# Patient Record
Sex: Female | Born: 1970 | Race: Black or African American | Hispanic: Yes | Marital: Single | Smoking: Never smoker
Health system: Southern US, Community
[De-identification: ages and names within clinical notes are randomized; demographics above are authoritative.]

## PROBLEM LIST (undated history)

## (undated) DIAGNOSIS — F32A Depression, unspecified: Secondary | ICD-10-CM

## (undated) DIAGNOSIS — R0789 Other chest pain: Secondary | ICD-10-CM

## (undated) DIAGNOSIS — E119 Type 2 diabetes mellitus without complications: Secondary | ICD-10-CM

## (undated) DIAGNOSIS — D649 Anemia, unspecified: Secondary | ICD-10-CM

## (undated) DIAGNOSIS — G4730 Sleep apnea, unspecified: Secondary | ICD-10-CM

## (undated) DIAGNOSIS — M1990 Unspecified osteoarthritis, unspecified site: Secondary | ICD-10-CM

## (undated) DIAGNOSIS — I10 Essential (primary) hypertension: Secondary | ICD-10-CM

## (undated) DIAGNOSIS — F419 Anxiety disorder, unspecified: Secondary | ICD-10-CM

## (undated) HISTORY — DX: Essential (primary) hypertension: I10

## (undated) HISTORY — DX: Morbid (severe) obesity due to excess calories: E66.01

## (undated) HISTORY — DX: Type 2 diabetes mellitus without complications: E11.9

## (undated) HISTORY — PX: ABDOMINAL HYSTERECTOMY: SHX81

## (undated) HISTORY — DX: Anemia, unspecified: D64.9

## (undated) HISTORY — DX: Other chest pain: R07.89

## (undated) HISTORY — DX: Unspecified osteoarthritis, unspecified site: M19.90

## (undated) HISTORY — DX: Depression, unspecified: F32.A

## (undated) HISTORY — DX: Sleep apnea, unspecified: G47.30

## (undated) HISTORY — DX: Anxiety disorder, unspecified: F41.9

---

## 2014-09-27 ENCOUNTER — Emergency Department (HOSPITAL_COMMUNITY): Payer: Self-pay

## 2014-09-27 ENCOUNTER — Encounter (HOSPITAL_COMMUNITY): Payer: Self-pay | Admitting: Family Medicine

## 2014-09-27 ENCOUNTER — Emergency Department (HOSPITAL_COMMUNITY)
Admission: EM | Admit: 2014-09-27 | Discharge: 2014-09-27 | Disposition: A | Discharge status: Home / Self Care | Payer: Self-pay | Attending: Emergency Medicine | Admitting: Emergency Medicine

## 2014-09-27 DIAGNOSIS — M25561 Pain in right knee: Secondary | ICD-10-CM | POA: Insufficient documentation

## 2014-09-27 DIAGNOSIS — I10 Essential (primary) hypertension: Secondary | ICD-10-CM | POA: Insufficient documentation

## 2014-09-27 DIAGNOSIS — Z791 Long term (current) use of non-steroidal anti-inflammatories (NSAID): Secondary | ICD-10-CM | POA: Insufficient documentation

## 2014-09-27 MED ORDER — HYDROCODONE-ACETAMINOPHEN 5-325 MG PO TABS
1.0000 | ORAL_TABLET | Freq: Once | ORAL | Status: AC
  Administered 2014-09-27: 1 via ORAL
  Filled 2014-09-27: qty 1

## 2014-09-27 MED ORDER — IBUPROFEN 800 MG PO TABS
800.0000 mg | ORAL_TABLET | Freq: Once | ORAL | Status: AC
  Administered 2014-09-27: 800 mg via ORAL
  Filled 2014-09-27: qty 1

## 2014-09-27 NOTE — ED Notes (Signed)
Pt having right knee pain behind her knee and swelling.

## 2014-09-27 NOTE — ED Notes (Signed)
Pt placed into gown and on monitor upon arrival to room. Pt monitored by blood pressure, pulse ox. Pts family remains at bedside.

## 2014-09-27 NOTE — ED Provider Notes (Signed)
CSN: [id number]     Arrival date & time 09/27/14  0900 History   First MD Initiated Contact with Patient 09/27/14 [phone]     Chief Complaint  Patient presents with  . Knee Pain     (Consider location/radiation/quality/duration/timing/severity/associated sxs/prior Treatment) HPI  Kathleen Stout is a 43 y.o. morbidly obsese female with PMH of HTN presenting with right knee pain for two week without worsening. Patient notes sharp pain and swelling behind knee. Pain worse with standing, walking. Never had knee pain before. Pt works as a Arboriculturistcustodian. Denies injury/trauma or history of trauma. No fever/chills, nausea, vomiting. Pt denies weakness, numbness tingling. Patient ambulatory. Patient new to the area and without PCP, has never seen a orthopedist.   Past Medical History  Diagnosis Date  . Hypertension    Past Surgical History  Procedure Laterality Date  . Abdominal hysterectomy     History reviewed. No pertinent family history. History  Substance Use Topics  . Smoking status: Never Smoker   . Smokeless tobacco: Not on file  . Alcohol Use: No   OB History    No data available     Review of Systems  Musculoskeletal: Negative for joint swelling.  Skin: Negative for wound.  Neurological: Negative for weakness and numbness.      Allergies  Review of patient's allergies indicates no known allergies.  Home Medications   Prior to Admission medications   Medication Sig Start Date End Date Taking? Authorizing Provider  naproxen sodium (ANAPROX) 220 MG tablet Take 440 mg by mouth 2 (two) times daily with a meal.   Yes Historical Provider, MD   BP 157/128 mmHg  Pulse 94  Temp(Src) 98.1 F (36.7 C) (Oral)  Resp 22  SpO2 98% Physical Exam  Constitutional: She appears well-developed and well-nourished.  HENT:  Head: Normocephalic and atraumatic.  Eyes: Conjunctivae are normal. Right eye exhibits no discharge. Left eye exhibits no discharge.  Pulmonary/Chest: Effort normal.   Musculoskeletal:  Tenderness over lateral joint line of right knee. Pain with ROM. No laxity noted, erythema, warmth. Edema hard to determine due to patient's body habitus. No joint effusion. Mild edema of posterior knee without mass, cyst. Pt antalgic gait. Neurovascularly intact.  Neurological: She is alert. She exhibits normal muscle tone.  Skin: Skin is warm and dry.  Psychiatric: She has a normal mood and affect. Her behavior is normal.  Nursing note and vitals reviewed.   ED Course  Procedures (including critical care time) Labs Review Labs Reviewed - No data to display  Imaging Review No results found.   EKG Interpretation None      Meds given in ED:  Medications  HYDROcodone-acetaminophen (NORCO/VICODIN) 5-325 MG per tablet 1 tablet (1 tablet Oral Given 09/27/14 1251)  ibuprofen (ADVIL,MOTRIN) tablet 800 mg (800 mg Oral Given 09/27/14 1251)    Discharge Medication List as of 09/27/2014  1:08 PM        MDM   Final diagnoses:  Knee pain, right   Patient with one week history of right knee pain. Tenderness to lateral joint ling on right and pain with ROM and ambulation. VSS. Neurovascularly intact. No laxity, warmth, edema, erythema, fevers. Antalgic gait. I doubt septic arthritis. tx with RICE and follow up with orthopedist for further work up.   Discussed return precautions with patient. Discussed all results and patient verbalizes understanding and agrees with plan.  This is a shared patient. This patient was discussed with the physician who saw and evaluated the patient and  agrees with the plan.     Louann SjogrenVictoria L Oberon Hehir, PA-C 09/27/14 [phone]

## 2014-09-27 NOTE — Discharge Instructions (Signed)
Return to the emergency room with worsening of symptoms, new symptoms or with symptoms that are concerning, especially fevers, chills, nausea, vomiting, swelling, redness, severe tenderness or warmth.  RICE: Rest, Ice (three cycles of 20 mins on, 20mins off at least twice a day), compression/brace, elevation. Heating pad works well for back pain. Ibuprofen 400mg  (2 tablets 200mg ) every 5-6 hours for 3-5 days and then as needed for pain. Follow up with orthopedist. Call tomorrow morning for appointment. Information provided above.   Arthritis, Nonspecific Arthritis is inflammation of a joint. This usually means pain, redness, warmth or swelling are present. One or more joints may be involved. There are a number of types of arthritis. Your caregiver may not be able to tell what type of arthritis you have right away. CAUSES  The most common cause of arthritis is the wear and tear on the joint (osteoarthritis). This causes damage to the cartilage, which can break down over time. The knees, hips, back and neck are most often affected by this type of arthritis. Other types of arthritis and common causes of joint pain include:  Sprains and other injuries near the joint. Sometimes minor sprains and injuries cause pain and swelling that develop hours later.  Rheumatoid arthritis. This affects hands, feet and knees. It usually affects both sides of your body at the same time. It is often associated with chronic ailments, fever, weight loss and general weakness.  Crystal arthritis. Gout and pseudo gout can cause occasional acute severe pain, redness and swelling in the foot, ankle, or knee.  Infectious arthritis. Bacteria can get into a joint through a break in overlying skin. This can cause infection of the joint. Bacteria and viruses can also spread through the blood and affect your joints.  Drug, infectious and allergy reactions. Sometimes joints can become mildly painful and slightly swollen with these  types of illnesses. SYMPTOMS   Pain is the main symptom.  Your joint or joints can also be red, swollen and warm or hot to the touch.  You may have a fever with certain types of arthritis, or even feel overall ill.  The joint with arthritis will hurt with movement. Stiffness is present with some types of arthritis. DIAGNOSIS  Your caregiver will suspect arthritis based on your description of your symptoms and on your exam. Testing may be needed to find the type of arthritis:  Blood and sometimes urine tests.  X-ray tests and sometimes CT or MRI scans.  Removal of fluid from the joint (arthrocentesis) is done to check for bacteria, crystals or other causes. Your caregiver (or a specialist) will numb the area over the joint with a local anesthetic, and use a needle to remove joint fluid for examination. This procedure is only minimally uncomfortable.  Even with these tests, your caregiver may not be able to tell what kind of arthritis you have. Consultation with a specialist (rheumatologist) may be helpful. TREATMENT  Your caregiver will discuss with you treatment specific to your type of arthritis. If the specific type cannot be determined, then the following general recommendations may apply. Treatment of severe joint pain includes:  Rest.  Elevation.  Anti-inflammatory medication (for example, ibuprofen) may be prescribed. Avoiding activities that cause increased pain.  Only take over-the-counter or prescription medicines for pain and discomfort as recommended by your caregiver.  Cold packs over an inflamed joint may be used for 10 to 15 minutes every hour. Hot packs sometimes feel better, but do not use overnight. Do not use  hot packs if you are diabetic without your caregiver's permission.  A cortisone shot into arthritic joints may help reduce pain and swelling.  Any acute arthritis that gets worse over the next 1 to 2 days needs to be looked at to be sure there is no joint  infection. Long-term arthritis treatment involves modifying activities and lifestyle to reduce joint stress jarring. This can include weight loss. Also, exercise is needed to nourish the joint cartilage and remove waste. This helps keep the muscles around the joint strong. HOME CARE INSTRUCTIONS   Do not take aspirin to relieve pain if gout is suspected. This elevates uric acid levels.  Only take over-the-counter or prescription medicines for pain, discomfort or fever as directed by your caregiver.  Rest the joint as much as possible.  If your joint is swollen, keep it elevated.  Use crutches if the painful joint is in your leg.  Drinking plenty of fluids may help for certain types of arthritis.  Follow your caregiver's dietary instructions.  Try low-impact exercise such as:  Swimming.  Water aerobics.  Biking.  Walking.  Morning stiffness is often relieved by a warm shower.  Put your joints through regular range-of-motion. SEEK MEDICAL CARE IF:   You do not feel better in 24 hours or are getting worse.  You have side effects to medications, or are not getting better with treatment. SEEK IMMEDIATE MEDICAL CARE IF:   You have a fever.  You develop severe joint pain, swelling or redness.  Many joints are involved and become painful and swollen.  There is severe back pain and/or leg weakness.  You have loss of bowel or bladder control. Document Released: 12/21/2004 Document Revised: 02/05/2012 Document Reviewed: 01/06/2009 [facility] Patient Information 2015 [employer] [place]. This information is not intended to replace advice given to you by your health care provider. Make sure you discuss any questions you have with your health care provider.

## 2015-04-19 ENCOUNTER — Encounter (HOSPITAL_COMMUNITY): Payer: Self-pay | Admitting: Family Medicine

## 2015-04-19 ENCOUNTER — Emergency Department (HOSPITAL_COMMUNITY)
Admission: EM | Admit: 2015-04-19 | Discharge: 2015-04-19 | Disposition: A | Discharge status: Home / Self Care | Payer: Self-pay | Attending: Emergency Medicine | Admitting: Emergency Medicine

## 2015-04-19 ENCOUNTER — Emergency Department (HOSPITAL_BASED_OUTPATIENT_CLINIC_OR_DEPARTMENT_OTHER): Admit: 2015-04-19 | Discharge: 2015-04-19 | Disposition: A | Discharge status: Home / Self Care | Payer: Self-pay

## 2015-04-19 ENCOUNTER — Emergency Department (HOSPITAL_COMMUNITY): Payer: Self-pay

## 2015-04-19 DIAGNOSIS — M1712 Unilateral primary osteoarthritis, left knee: Secondary | ICD-10-CM | POA: Insufficient documentation

## 2015-04-19 DIAGNOSIS — I10 Essential (primary) hypertension: Secondary | ICD-10-CM | POA: Insufficient documentation

## 2015-04-19 DIAGNOSIS — M25562 Pain in left knee: Secondary | ICD-10-CM

## 2015-04-19 DIAGNOSIS — M79609 Pain in unspecified limb: Secondary | ICD-10-CM

## 2015-04-19 MED ORDER — AMLODIPINE BESYLATE 5 MG PO TABS: 5.0000 mg | ORAL_TABLET | Freq: Once | ORAL | Status: DC

## 2015-04-19 MED ORDER — TRAMADOL HCL 50 MG PO TABS: 50.0000 mg | ORAL_TABLET | Freq: Four times a day (QID) | ORAL | Status: DC | PRN

## 2015-04-19 MED ORDER — AMLODIPINE BESYLATE 5 MG PO TABS
5.0000 mg | ORAL_TABLET | Freq: Once | ORAL | Status: AC
  Administered 2015-04-19: 5 mg via ORAL
  Filled 2015-04-19: qty 1

## 2015-04-19 NOTE — ED Provider Notes (Signed)
CSN: [id number]     Arrival date & time 04/19/15  1154 History   First MD Initiated Contact with Patient 04/19/15 1244     Chief Complaint  Patient presents with  . Leg Pain     (Consider location/radiation/quality/duration/timing/severity/associated sxs/prior Treatment) HPI Pt is a 44yo morbidly obese female presenting to ED with c/o gradually worsening Left knee pain and stiffness for 1 month.  Pt c/o constant, aching sore, 10/10, no relief with OTC medications. pain does improve with rest, worse with weight bearing and ambulation.  Denies falls or known injuries. Denies hx of previous knee surgeries. Denies hx of blood clots. Pt is not on estrogen pills. Is not a smoker. No recent travel. Denies redness or warmth.  Denies fever, n/v/d.   Past Medical History  Diagnosis Date  . Hypertension    Past Surgical History  Procedure Laterality Date  . Abdominal hysterectomy     History reviewed. No pertinent family history. History  Substance Use Topics  . Smoking status: Never Smoker   . Smokeless tobacco: Not on file  . Alcohol Use: No   OB History    No data available     Review of Systems  Constitutional: Negative for fever, chills, diaphoresis, appetite change and fatigue.  Musculoskeletal: Positive for myalgias, joint swelling, arthralgias and gait problem (due to severe pain in Left knee).       Left knee pain  Skin: Negative for color change, pallor, rash and wound.  Neurological: Negative for weakness and numbness.  All other systems reviewed and are negative.     Allergies  Review of patient's allergies indicates no known allergies.  Home Medications   Prior to Admission medications   Medication Sig Start Date End Date Taking? Authorizing Provider  ibuprofen (ADVIL,MOTRIN) 200 MG tablet Take 200 mg by mouth every 6 (six) hours as needed for fever or moderate pain.   Yes Historical Provider, MD  naproxen sodium (ANAPROX) 220 MG tablet Take 220 mg by mouth 2  (two) times daily as needed (pain).    Yes Historical Provider, MD  amLODipine (NORVASC) 5 MG tablet Take 1 tablet (5 mg total) by mouth once. 04/19/15   Junius Finner, PA-C  traMADol (ULTRAM) 50 MG tablet Take 1 tablet (50 mg total) by mouth every 6 (six) hours as needed. 04/19/15   Junius Finner, PA-C   BP 168/134 mmHg  Pulse 80  Temp(Src) 98.2 F (36.8 C) (Oral)  Resp 16  Ht  (1.626 m)  Wt 350 lb (158.759 kg)  BMI 60.05 kg/m2  SpO2 98% Physical Exam  Constitutional: She is oriented to person, place, and time. She appears well-developed and well-nourished.  Morbidly obese female lying in exam bed, NAD  HENT:  Head: Normocephalic and atraumatic.  Eyes: EOM are normal.  Neck: Normal range of motion.  Cardiovascular: Normal rate, regular rhythm and normal heart sounds.   Pulses:      Dorsalis pedis pulses are 2+ on the right side, and 2+ on the left side.  Pulmonary/Chest: Effort normal and breath sounds normal. No respiratory distress. She has no rales. She exhibits no tenderness.  Musculoskeletal: Normal range of motion. She exhibits tenderness. She exhibits no edema.  Left knee: obese lower extremities, no obvious swelling compared to Right knee or lower leg. No obvious deformity. Tenderness to medial and lateral joint spaces, worse in posterior knee and calf. Compartments are soft. Limited Knee Flexion due to pain.  Right knee: FROM, non-tender  Neurological: She  is alert and oriented to person, place, and time.  Skin: Skin is warm and dry. No erythema.  Left leg: skin in tact, no ecchymosis or erythema.  No warmth  Psychiatric: She has a normal mood and affect. Her behavior is normal.  Nursing note and vitals reviewed.   ED Course  Procedures (including critical care time) Labs Review Labs Reviewed - No data to display  Imaging Review Dg Knee Complete 4 Views Left  04/19/2015   CLINICAL DATA:  Pain and stiffness  EXAM: LEFT KNEE - 3 VIEW  COMPARISON:  None.  FINDINGS:  Standing frontal, standing tunnel, and lateral views obtained. No fracture or dislocation. No joint effusion. There is narrowing medially and in the patellofemoral joint region. There is mild spurring in all compartments as well as intracondylar spurring.  IMPRESSION: Areas of osteoarthritic change.  No fracture or effusion.   Electronically Signed   By: Bretta BangWilliam  Woodruff III M.D.   On: 04/19/2015 15:53     EKG Interpretation None      MDM   Final diagnoses:  Left knee pain  Osteoarthritis of left knee, unspecified osteoarthritis type  Essential hypertension   Pt is a 44yo morbidly obese female presenting to ED with c/o gradually worsening. No known injury.  Venous U/S: negative for DVT or superficial clot. Negative for Baker's cyst.  Plain films: significant for OA  Pt does have elevated BP. Pt reports hx of HTN. [place] never established care with a PCP. Will start pt on amlodipine. Care management consulted. Advised pt she may f/u with [facility]. Return precautions provided. Pt verbalized understanding and agreement with tx plan.    Junius Finnerrin O'Malley, PA-C 04/19/15 1605  Richardean Canalavid H Yao, MD 04/20/15 [phone]

## 2015-04-19 NOTE — ED Notes (Signed)
Patient alert and oriented at discharge.  Patient ambulatory to the waiting room with this RN.

## 2015-04-19 NOTE — ED Notes (Signed)
Pt returned from ultrasound

## 2015-04-19 NOTE — ED Notes (Signed)
Off unit with xray. 

## 2015-04-19 NOTE — ED Notes (Signed)
Pt here for left leg pain and stiffness. sts pain behind knee. Pt hypertensive today at 194/99.

## 2015-04-19 NOTE — Progress Notes (Signed)
VASCULAR LAB PRELIMINARY  PRELIMINARY  PRELIMINARY  PRELIMINARY  Left lower extremity venous Doppler completed.    Preliminary report:  There is no DVT, SVT, or Baker's cyst noted in the left lower extremity.   Kathleen Stout, RVT 04/19/2015, 2:08 PM

## 2015-04-20 ENCOUNTER — Ambulatory Visit: Payer: Self-pay | Attending: Family Medicine | Admitting: Family Medicine

## 2015-04-20 ENCOUNTER — Encounter: Payer: Self-pay | Admitting: Family Medicine

## 2015-04-20 VITALS — BP 167/107 | HR 88 | Wt 319.8 lb

## 2015-04-20 DIAGNOSIS — I10 Essential (primary) hypertension: Secondary | ICD-10-CM

## 2015-04-20 DIAGNOSIS — M25562 Pain in left knee: Secondary | ICD-10-CM

## 2015-04-20 LAB — CBC WITH DIFFERENTIAL/PLATELET
BASOS ABS: 0 10*3/uL (ref 0.0–0.1)
Basophils Relative: 0 % (ref 0–1)
Eosinophils Absolute: 0.1 10*3/uL (ref 0.0–0.7)
Eosinophils Relative: 1 % (ref 0–5)
HEMATOCRIT: 43.6 % (ref 36.0–46.0)
HEMOGLOBIN: 14.6 g/dL (ref 12.0–15.0)
Lymphocytes Relative: 33 % (ref 12–46)
Lymphs Abs: 2.6 10*3/uL (ref 0.7–4.0)
MCH: 29.4 pg (ref 26.0–34.0)
MCHC: 33.5 g/dL (ref 30.0–36.0)
MCV: 87.7 fL (ref 78.0–100.0)
MPV: 10 fL (ref 8.6–12.4)
Monocytes Absolute: 0.6 10*3/uL (ref 0.1–1.0)
Monocytes Relative: 8 % (ref 3–12)
NEUTROS ABS: 4.6 10*3/uL (ref 1.7–7.7)
Neutrophils Relative %: 58 % (ref 43–77)
Platelets: 251 10*3/uL (ref 150–400)
RBC: 4.97 MIL/uL (ref 3.87–5.11)
RDW: 14.1 % (ref 11.5–15.5)
WBC: 8 10*3/uL (ref 4.0–10.5)

## 2015-04-20 LAB — LIPID PANEL
Cholesterol: 179 mg/dL (ref 0–200)
HDL: 48 mg/dL (ref >=46)
LDL Cholesterol: 111 mg/dL — ABNORMAL HIGH (ref 0–99)
TRIGLYCERIDES: 102 mg/dL (ref <150)
Total CHOL/HDL Ratio: 3.7 Ratio
VLDL: 20 mg/dL (ref 0–40)

## 2015-04-20 LAB — COMPLETE METABOLIC PANEL WITH GFR
ALBUMIN: 4 g/dL (ref 3.5–5.2)
ALK PHOS: 65 U/L (ref 39–117)
ALT: 19 U/L (ref 0–35)
AST: 15 U/L (ref 0–37)
BILIRUBIN TOTAL: 0.4 mg/dL (ref 0.2–1.2)
BUN: 12 mg/dL (ref 6–23)
CHLORIDE: 105 meq/L (ref 96–112)
CO2: 26 meq/L (ref 19–32)
CREATININE: 0.86 mg/dL (ref 0.50–1.10)
Calcium: 9.3 mg/dL (ref 8.4–10.5)
GFR, EST NON AFRICAN AMERICAN: 82 mL/min
GLUCOSE: 129 mg/dL — AB (ref 70–99)
POTASSIUM: 3.9 meq/L (ref 3.5–5.3)
SODIUM: 137 meq/L (ref 135–145)
Total Protein: 6.9 g/dL (ref 6.0–8.3)

## 2015-04-20 LAB — TSH: TSH: 2.686 u[IU]/mL (ref 0.350–4.500)

## 2015-04-20 MED ORDER — AMLODIPINE BESYLATE 10 MG PO TABS: 10.0000 mg | ORAL_TABLET | Freq: Every day | ORAL | Status: DC

## 2015-04-20 NOTE — Progress Notes (Signed)
Patient ID: Kathleen Stout, female   DOB: 05/08/1971, 44 y.o.   MRN: [id number]   Kathleen Stout, is a 44 y.o. female  [id number]  [id number]  DOB - 12/06/1970  CC:  Chief Complaint  Patient presents with  . New patient    Hypertension       HPI: Kathleen Stout is a 44 y.o. female here today to establish medical care. She was seen in ED a few days ago with left knee pain. DVT was ruled out as well as a Bakers cysts. She was diagnosed with osteoarthritis of left knee and hypertension and told to follow-up here for recheck. Her medical history is positive for anemia due to heavy menstrual bleeding She has had a hysterectomy. She reports she has had no health care or bloodwork in a least a year. She reports no mammogram at age 44. She was started on amlodipine 5 mg for a BP of 168/134. Her BP here today was 155/98 and a repeat was 160/108  No Known Allergies Past Medical History  Diagnosis Date  . Hypertension   . Anemia   . Arthritis    Current Outpatient Prescriptions on File Prior to Visit  Medication Sig Dispense Refill  . amLODipine (NORVASC) 5 MG tablet Take 1 tablet (5 mg total) by mouth once. (Patient taking differently: Take 10 mg by mouth once. Take 2 of your 5 mg until out then switch to 10 mg.) 30 tablet 0  . traMADol (ULTRAM) 50 MG tablet Take 1 tablet (50 mg total) by mouth every 6 (six) hours as needed. 15 tablet 0  . ibuprofen (ADVIL,MOTRIN) 200 MG tablet Take 200 mg by mouth every 6 (six) hours as needed for fever or moderate pain.    . naproxen sodium (ANAPROX) 220 MG tablet Take 220 mg by mouth 2 (two) times daily as needed (pain).      No current facility-administered medications on file prior to visit.   No family history on file. History   Social History  . Marital Status: Single    Spouse Name: N/A  . Number of Children: N/A  . Years of Education: N/A   Occupational History  . Not on file.   Social History Main Topics  . Smoking status: Never  Smoker   . Smokeless tobacco: Not on file  . Alcohol Use: No  . Drug Use: No  . Sexual Activity: Not on file   Other Topics Concern  . Not on file   Social History Narrative    Review of Systems: Constitutional: Negative for fever, chills, appetite change, weight loss,  fatigue. HENT: Negative for ear pain, ear discharge.nose bleeds Eyes: Negative for pain, discharge, redness, itching and visual disturbance. Wears glasses Neck: Negative for pain, stiffness Respiratory: Negative for cough, shortness of breath,   Cardiovascular: Negative for chest pain, palpitations and leg swelling. Gastrointestinal: Negative for abdominal distention, abdominal pain, nausea, vomiting, diarrhea, constipations Genitourinary: Negative for dysuria, urgency, frequency, hematuria, flank pain,  Musculoskeletal: Negative for back pain, Positive for left knee pain, with some difficulty walking due to pain Neurological: Negative for dizziness, tremors, seizures, syncope,   light-headedness, numbness Positive for  Headaches. Hematological: Negative for easy bruising or bleeding Psychiatric/Behavioral: Negative for depression, anxiety, decreased concentration, confusion   Objective:   Filed Vitals:   04/20/15 1455  BP: 167/107  Pulse:     Physical Exam: Constitutional: Patient appears well-developed and well-nourished. No distress. HENT: Normocephalic, atraumatic, External right and left ear normal. Oropharynx is clear and  moist.  Eyes: Conjunctivae and EOM are normal. PERRLA, no scleral icterus. Neck: Normal ROM. Neck supple. No lymphadenopathy, No thyromegaly. CVS: RRR, S1/S2 +, no murmurs, no gallops, no rubs Pulmonary: Effort and breath sounds normal, no stridor, rhonchi, wheezes, rales.  Abdominal: Soft. Normoactive BS,, no distension, tenderness, rebound or guarding.  Musculoskeletal: Normal range of motion. There is mild swelling and tenderness of the left knee Neuro: Alert.Normal muscle tone  coordination. Non-focal Skin: Skin is warm and dry. No rash noted. Not diaphoretic. No erythema. No pallor. Psychiatric: Normal mood and affect. Behavior, judgment, thought content normal.  No results found for: WBC, HGB, HCT, MCV, PLT No results found for: CREATININE, BUN, NA, K, CL, CO2  No results found for: HGBA1C Lipid Panel  No results found for: CHOL, TRIG, HDL, CHOLHDL, VLDL, LDLCALC     Assessment and plan:  1. Hypertension     Increase amlodipine to 10 mg daily      Follow-up with assigned PCP in two weeks.   1. Knee pain, left     Medications and instruction per ED.     Follow-up as needed      The patient was given clear instructions to go to ER or return to medical center if symptoms don't improve, worsen or new problems develop. The patient verbalized understanding. The patient was told to call to get lab results if they haven't heard anything in the next week.        Henrietta Hoover, MSN, FNP-BC [facility] [place], [place] [phone]   04/20/2015, 2:59 PM

## 2015-04-20 NOTE — Patient Instructions (Signed)
Take 2 of your amlodipine 5 mg until gone and then switch to 10 mg daily. We are doing labs today. Will call with results. Make appointment for 2 weeks with assigned provider Dash diet.  We will do a referral for mammogram. Someone will call you about appointment.    DASH Eating Plan DASH stands for "Dietary Approaches to Stop Hypertension." The DASH eating plan is a healthy eating plan that has been shown to reduce high blood pressure (hypertension). Additional health benefits may include reducing the risk of type 2 diabetes mellitus, heart disease, and stroke. The DASH eating plan may also help with weight loss. WHAT DO I NEED TO KNOW ABOUT THE DASH EATING PLAN? For the DASH eating plan, you will follow these general guidelines:  Choose foods with a percent daily value for sodium of less than 5% (as listed on the food label).  Use salt-free seasonings or herbs instead of table salt or sea salt.  Check with your health care provider or pharmacist before using salt substitutes.  Eat lower-sodium products, often labeled as "lower sodium" or "no salt added."  Eat fresh foods.  Eat more vegetables, fruits, and low-fat dairy products.  Choose whole grains. Look for the word "whole" as the first word in the ingredient list.  Choose fish and skinless chicken or [place] more often than red meat. Limit fish, poultry, and meat to 6 oz (170 g) each day.  Limit sweets, desserts, sugars, and sugary drinks.  Choose heart-healthy fats.  Limit cheese to 1 oz (28 g) per day.  Eat more home-cooked food and less restaurant, buffet, and fast food.  Limit fried foods.  Cook foods using methods other than frying.  Limit canned vegetables. If you do use them, rinse them well to decrease the sodium.  When eating at a restaurant, ask that your food be prepared with less salt, or no salt if possible. WHAT FOODS CAN I EAT? Seek help from a dietitian for individual calorie needs. Grains Whole  grain or whole wheat bread. Brown rice. Whole grain or whole wheat pasta. Quinoa, bulgur, and whole grain cereals. Low-sodium cereals. Corn or whole wheat flour tortillas. Whole grain cornbread. Whole grain crackers. Low-sodium crackers. Vegetables Fresh or frozen vegetables (raw, steamed, roasted, or grilled). Low-sodium or reduced-sodium tomato and vegetable juices. Low-sodium or reduced-sodium tomato sauce and paste. Low-sodium or reduced-sodium canned vegetables.  Fruits All fresh, canned (in natural juice), or frozen fruits. Meat and Other Protein Products Ground beef (85% or leaner), grass-fed beef, or beef trimmed of fat. Skinless chicken or [place]. Ground chicken or [place]. Pork trimmed of fat. All fish and seafood. Eggs. Dried beans, peas, or lentils. Unsalted nuts and seeds. Unsalted canned beans. Dairy Low-fat dairy products, such as skim or 1% milk, 2% or reduced-fat cheeses, low-fat ricotta or cottage cheese, or plain low-fat yogurt. Low-sodium or reduced-sodium cheeses. Fats and Oils Tub margarines without trans fats. Light or reduced-fat mayonnaise and salad dressings (reduced sodium). Avocado. Safflower, olive, or canola oils. Natural peanut or almond butter. Other Unsalted popcorn and pretzels. The items listed above may not be a complete list of recommended foods or beverages. Contact your dietitian for more options. WHAT FOODS ARE NOT RECOMMENDED? Grains White bread. White pasta. White rice. Refined cornbread. Bagels and croissants. Crackers that contain trans fat. Vegetables Creamed or fried vegetables. Vegetables in a cheese sauce. Regular canned vegetables. Regular canned tomato sauce and paste. Regular tomato and vegetable juices. Fruits Dried fruits. Canned fruit in light or  heavy syrup. Fruit juice. Meat and Other Protein Products Fatty cuts of meat. Ribs, chicken wings, bacon, sausage, bologna, salami, chitterlings, fatback, hot dogs, bratwurst, and packaged luncheon  meats. Salted nuts and seeds. Canned beans with salt. Dairy Whole or 2% milk, cream, half-and-half, and cream cheese. Whole-fat or sweetened yogurt. Full-fat cheeses or blue cheese. Nondairy creamers and whipped toppings. Processed cheese, cheese spreads, or cheese curds. Condiments Onion and garlic salt, seasoned salt, table salt, and sea salt. Canned and packaged gravies. Worcestershire sauce. Tartar sauce. Barbecue sauce. Teriyaki sauce. Soy sauce, including reduced sodium. Steak sauce. Fish sauce. Oyster sauce. Cocktail sauce. Horseradish. Ketchup and mustard. Meat flavorings and tenderizers. Bouillon cubes. Hot sauce. Tabasco sauce. Marinades. Taco seasonings. Relishes. Fats and Oils Butter, stick margarine, lard, shortening, ghee, and bacon fat. Coconut, palm kernel, or palm oils. Regular salad dressings. Other Pickles and olives. Salted popcorn and pretzels. The items listed above may not be a complete list of foods and beverages to avoid. Contact your dietitian for more information. WHERE CAN I FIND MORE INFORMATION? National Heart, Lung, and Blood Institute: travelstabloid.com Document Released: 11/02/2011 Document Revised: 03/30/2014 Document Reviewed: 09/17/2013 [facility] Patient Information 2015 [place], [place]. This information is not intended to replace advice given to you by your health care provider. Make sure you discuss any questions you have with your health care provider.

## 2015-05-04 ENCOUNTER — Encounter: Payer: Self-pay | Admitting: Internal Medicine

## 2015-05-04 ENCOUNTER — Ambulatory Visit: Payer: Self-pay | Attending: Internal Medicine | Admitting: Internal Medicine

## 2015-05-04 VITALS — BP 142/70 | HR 70 | Temp 97.5°F | Ht 64.0 in | Wt 320.5 lb

## 2015-05-04 DIAGNOSIS — M1712 Unilateral primary osteoarthritis, left knee: Secondary | ICD-10-CM

## 2015-05-04 DIAGNOSIS — R7303 Prediabetes: Secondary | ICD-10-CM

## 2015-05-04 DIAGNOSIS — M179 Osteoarthritis of knee, unspecified: Secondary | ICD-10-CM

## 2015-05-04 DIAGNOSIS — R7309 Other abnormal glucose: Secondary | ICD-10-CM

## 2015-05-04 DIAGNOSIS — I10 Essential (primary) hypertension: Secondary | ICD-10-CM

## 2015-05-04 LAB — GLUCOSE, POCT (MANUAL RESULT ENTRY): POC GLUCOSE: 92 mg/dL (ref 70–99)

## 2015-05-04 LAB — POCT GLYCOSYLATED HEMOGLOBIN (HGB A1C): HEMOGLOBIN A1C: 6.7

## 2015-05-04 MED ORDER — METFORMIN HCL 500 MG PO TABS: 500.0000 mg | ORAL_TABLET | Freq: Two times a day (BID) | ORAL | Status: DC

## 2015-05-04 MED ORDER — LISINOPRIL 5 MG PO TABS: 5.0000 mg | ORAL_TABLET | Freq: Every day | ORAL | Status: DC

## 2015-05-04 NOTE — Patient Instructions (Signed)
2 WEEKS FOR A NURSE TO CHECK bp

## 2015-05-04 NOTE — Progress Notes (Signed)
Patient seen in ED and diagnosed with left knee osteoarthritis She was given Tramadol (15 pills) she has taken and states they do not help with the pain She was seen her by Concepcion LivingLinda Bernhardt recently as well who increased her amlodipine from 5mg  to 10mg  She has been taking new dosage Patient would like you to go over her lab results (her fasting glucose was 127) Patient had hysterectomy for heavy menstrual bleeding

## 2015-05-04 NOTE — Progress Notes (Signed)
Patient ID: Kathleen Stout, female   DOB: Apr 01, 1971, 44 y.o.   MRN: [id number]  CC: knee pain, HTN   HPI: Kathleen Stout is a 44 y.o. female here today for a follow up visit.  Patient has past medical history of HTN, anemia, and arthritis.  He was last seen here by Concepcion Living, NP who increased her amlodipine to 10 mg. She reports taking the medication daily without any noted complications. She denies c/o headaches, blurred vision, chest pain, palpitations, or edema.   Today she presents for evaluation of continued left knee pain. She was seen in teh ER on 5/23 of the same concern and was given 15 tramadol pills but states that it did not help her pain at all. She has since been taking ibuprofen for pain. X-ray revealed osteoarthritis. Pain described as a stiffness, which is mostly in the morning. No redness or warmth of joint.  Patient has No headache, No chest pain, No abdominal pain - No Nausea, No new weakness tingling or numbness, No Cough - SOB.  No Known Allergies Past Medical History  Diagnosis Date  . Hypertension   . Anemia   . Arthritis    Current Outpatient Prescriptions on File Prior to Visit  Medication Sig Dispense Refill  . amLODipine (NORVASC) 10 MG tablet Take 1 tablet (10 mg total) by mouth daily. 90 tablet 3   No current facility-administered medications on file prior to visit.   No family history on file. History   Social History  . Marital Status: Single    Spouse Name: N/A  . Number of Children: N/A  . Years of Education: N/A   Occupational History  . Not on file.   Social History Main Topics  . Smoking status: Never Smoker   . Smokeless tobacco: Not on file  . Alcohol Use: No  . Drug Use: No  . Sexual Activity: Not on file   Other Topics Concern  . Not on file   Social History Narrative    Review of Systems: See HPI    Objective:   Filed Vitals:   05/04/15 1530  BP: 155/90  Pulse: 70  Temp: 97.5 F (36.4 C)    Physical Exam   Constitutional: She is oriented to person, place, and time.  Cardiovascular: Normal rate, regular rhythm and normal heart sounds.   Pulmonary/Chest: Effort normal and breath sounds normal.  Musculoskeletal: She exhibits no edema.  Crepitus with left knee ROM  Neurological: She is alert and oriented to person, place, and time.  Skin: Skin is warm and dry.     Lab Results  Component Value Date   WBC 8.0 04/20/2015   HGB 14.6 04/20/2015   HCT 43.6 04/20/2015   MCV 87.7 04/20/2015   PLT 251 04/20/2015   Lab Results  Component Value Date   CREATININE 0.86 04/20/2015   BUN 12 04/20/2015   NA 137 04/20/2015   K 3.9 04/20/2015   CL 105 04/20/2015   CO2 26 04/20/2015    No results found for: HGBA1C Lipid Panel     Component Value Date/Time   CHOL 179 04/20/2015 1505   TRIG 102 04/20/2015 1505   HDL 48 04/20/2015 1505   CHOLHDL 3.7 04/20/2015 1505   VLDL 20 04/20/2015 1505   LDLCALC 111* 04/20/2015 1505       Assessment and plan:   Kathleen Stout was seen today for knee pain and hypertension.  Diagnoses and all orders for this visit:  Osteoarthritis of left knee, unspecified  osteoarthritis type Patient reports that she would like to take tylenol for pain because she does not want medication that will make her addicted.  I have advised heat and weight loss. Once she gets orange card I will refer to Ortho for joint injections   Morbid obesity Orders: -     metFORMIN (GLUCOPHAGE) 500 MG tablet; Take 1 tablet (500 mg total) by mouth 2 (two) times daily with a meal. Prediabetic and will help with weight loss. Weight loss discussed at length and its complications to health.  Patient will loss 10 months by next visit in 3 months.  Diet and exercise discussed as well as calorie intake.  Essential hypertension Orders: -    Begin lisinopril (PRINIVIL,ZESTRIL) 5 MG tablet; Take 1 tablet (5 mg total) by mouth daily. She will continue amlodipine but begin lisinopril as well for kidney  protection and better BP control  Elevated glucose Orders: -     POCT glycosylated hemoglobin (Hb A1C) -     POCT glucose (manual entry) Per lab results on last visit  Prediabetes Orders: -     metFORMIN (GLUCOPHAGE) 500 MG tablet; Take 1 tablet (500 mg total) by mouth 2 (two) times daily with a meal. Patient is overweight and has a family history of diabetes. Her A1C was 6.7%. I have explained that she mets the criteria for diagnoses but she may begin to work on diet and exercise to lower number. With her weight and family history I have decided to start her on Metformin. Will check bmet on next exam.  Specific diet changes discussed.  >50 % of visit spent counseling and educating on diabetes   Return in about 2 weeks (around 05/18/2015) for Nurse Visit-bp CHECK AND 3 MO pcp .       Holland CommonsKECK, Tauna Macfarlane, NP-C [facility] [phone] 05/04/2015, 4:15 PM

## 2015-05-05 ENCOUNTER — Encounter: Payer: Self-pay | Admitting: Internal Medicine

## 2015-05-18 ENCOUNTER — Other Ambulatory Visit: Payer: Self-pay | Admitting: Internal Medicine

## 2015-05-18 ENCOUNTER — Telehealth: Payer: Self-pay | Admitting: Internal Medicine

## 2015-05-18 ENCOUNTER — Ambulatory Visit: Payer: Self-pay | Attending: Internal Medicine

## 2015-05-18 DIAGNOSIS — M1712 Unilateral primary osteoarthritis, left knee: Secondary | ICD-10-CM

## 2015-05-18 NOTE — Telephone Encounter (Signed)
Pt has been informed of referral.

## 2015-05-18 NOTE — Telephone Encounter (Signed)
Patient has competed her financial assistance and would like to be referred to an orthopedics; please f/u with patient about this request

## 2015-05-18 NOTE — Telephone Encounter (Signed)
This has been complete, please inform patient

## 2015-05-21 ENCOUNTER — Encounter: Payer: Self-pay | Admitting: Sports Medicine

## 2015-05-21 ENCOUNTER — Ambulatory Visit: Payer: Self-pay | Attending: Internal Medicine | Admitting: *Deleted

## 2015-05-21 ENCOUNTER — Ambulatory Visit (INDEPENDENT_AMBULATORY_CARE_PROVIDER_SITE_OTHER): Payer: Self-pay | Admitting: Sports Medicine

## 2015-05-21 VITALS — BP 156/116 | HR 82 | Ht 64.0 in | Wt 320.0 lb

## 2015-05-21 VITALS — BP 139/91 | HR 93 | Temp 97.9°F | Resp 18 | Wt 313.8 lb

## 2015-05-21 DIAGNOSIS — M25569 Pain in unspecified knee: Secondary | ICD-10-CM

## 2015-05-21 DIAGNOSIS — M1712 Unilateral primary osteoarthritis, left knee: Secondary | ICD-10-CM

## 2015-05-21 DIAGNOSIS — I10 Essential (primary) hypertension: Secondary | ICD-10-CM | POA: Insufficient documentation

## 2015-05-21 MED ORDER — LISINOPRIL 10 MG PO TABS: 10.0000 mg | ORAL_TABLET | Freq: Every day | ORAL | Status: DC

## 2015-05-21 MED ORDER — KETOROLAC TROMETHAMINE 30 MG/ML IJ SOLN
30.0000 mg | Freq: Once | INTRAMUSCULAR | Status: AC
  Administered 2015-05-21: 30 mg via INTRAMUSCULAR

## 2015-05-21 MED ORDER — NAPROXEN 500 MG PO TABS: 500.0000 mg | ORAL_TABLET | Freq: Two times a day (BID) | ORAL | Status: DC | PRN

## 2015-05-21 MED ORDER — HYDROCHLOROTHIAZIDE 12.5 MG PO TABS: 12.5000 mg | ORAL_TABLET | Freq: Every day | ORAL | Status: DC

## 2015-05-21 MED ORDER — TRAMADOL HCL 50 MG PO TABS: 50.0000 mg | ORAL_TABLET | Freq: Two times a day (BID) | ORAL | Status: DC

## 2015-05-21 NOTE — Progress Notes (Signed)
Patient here for blood pressure check.  Patient went to Orthopedic today, states BP was 167/119 and was unable to get steroid injection.  Patient took Lisinopril and Metformin today at 7am.  Current BP 139/91. Orthopedist prescribed Hydrochlorothiazide and Naproxen which she hasn't taken yet.  Patient states she sometimes adds salt to food, but is familiar with Mrs. Dash. Patient reports having a headache yesterday, but denies blurred vision, shortness of breath, and chest pain.  No daily exercise, but about to join gym (water aerobics).

## 2015-05-21 NOTE — Patient Instructions (Signed)
Hypertension Hypertension is another name for high blood pressure. High blood pressure forces your heart to work harder to pump blood. A blood pressure reading has two numbers, which includes a higher number over a lower number (example: 110/72). HOME CARE   Have your blood pressure rechecked by your doctor.  Only take medicine as told by your doctor. Follow the directions carefully. The medicine does not work as well if you skip doses. Skipping doses also puts you at risk for problems.  Do not smoke.  Monitor your blood pressure at home as told by your doctor. GET HELP IF:  You think you are having a reaction to the medicine you are taking.  You have repeat headaches or feel dizzy.  You have puffiness (swelling) in your ankles.  You have trouble with your vision. GET HELP RIGHT AWAY IF:   You get a very bad headache and are confused.  You feel weak, numb, or faint.  You get chest or belly (abdominal) pain.  You throw up (vomit).  You cannot breathe very well. MAKE SURE YOU:   Understand these instructions.  Will watch your condition.  Will get help right away if you are not doing well or get worse. Document Released: 05/01/2008 Document Revised: 11/18/2013 Document Reviewed: 09/05/2013 [employer] Patient Information 2015 [employer]. This information is not intended to replace advice given to you by your health care provider. Make sure you discuss any questions you have with your health care provider.  DASH Eating Plan DASH stands for "Dietary Approaches to Stop Hypertension." The DASH eating plan is a healthy eating plan that has been shown to reduce high blood pressure (hypertension). Additional health benefits may include reducing the risk of type 2 diabetes mellitus, heart disease, and stroke. The DASH eating plan may also help with weight loss. WHAT DO I NEED TO KNOW ABOUT THE DASH EATING PLAN? For the DASH eating plan, you will follow these general  guidelines:  Choose foods with a percent daily value for sodium of less than 5% (as listed on the food label).  Use salt-free seasonings or herbs instead of table salt or sea salt.  Check with your health care provider or pharmacist before using salt substitutes.  Eat lower-sodium products, often labeled as "lower sodium" or "no salt added."  Eat fresh foods.  Eat more vegetables, fruits, and low-fat dairy products.  Choose whole grains. Look for the word "whole" as the first word in the ingredient list.  Choose fish and skinless chicken or turkey more often than red meat. Limit fish, poultry, and meat to 6 oz (170 g) each day.  Limit sweets, desserts, sugars, and sugary drinks.  Choose heart-healthy fats.  Limit cheese to 1 oz (28 g) per day.  Eat more home-cooked food and less restaurant, buffet, and fast food.  Limit fried foods.  Cook foods using methods other than frying.  Limit canned vegetables. If you do use them, rinse them well to decrease the sodium.  When eating at a restaurant, ask that your food be prepared with less salt, or no salt if possible. WHAT FOODS CAN I EAT? Seek help from a dietitian for individual calorie needs. Grains Whole grain or whole wheat bread. Brown rice. Whole grain or whole wheat pasta. Quinoa, bulgur, and whole grain cereals. Low-sodium cereals. Corn or whole wheat flour tortillas. Whole grain cornbread. Whole grain crackers. Low-sodium crackers. Vegetables Fresh or frozen vegetables (raw, steamed, roasted, or grilled). Low-sodium or reduced-sodium tomato and vegetable juices. Low-sodium   or reduced-sodium tomato sauce and paste. Low-sodium or reduced-sodium canned vegetables.  Fruits All fresh, canned (in natural juice), or frozen fruits. Meat and Other Protein Products Ground beef (85% or leaner), grass-fed beef, or beef trimmed of fat. Skinless chicken or turkey. Ground chicken or turkey. Pork trimmed of fat. All fish and seafood.  Eggs. Dried beans, peas, or lentils. Unsalted nuts and seeds. Unsalted canned beans. Dairy Low-fat dairy products, such as skim or 1% milk, 2% or reduced-fat cheeses, low-fat ricotta or cottage cheese, or plain low-fat yogurt. Low-sodium or reduced-sodium cheeses. Fats and Oils Tub margarines without trans fats. Light or reduced-fat mayonnaise and salad dressings (reduced sodium). Avocado. Safflower, olive, or canola oils. Natural peanut or almond butter. Other Unsalted popcorn and pretzels. The items listed above may not be a complete list of recommended foods or beverages. Contact your dietitian for more options. WHAT FOODS ARE NOT RECOMMENDED? Grains White bread. White pasta. White rice. Refined cornbread. Bagels and croissants. Crackers that contain trans fat. Vegetables Creamed or fried vegetables. Vegetables in a cheese sauce. Regular canned vegetables. Regular canned tomato sauce and paste. Regular tomato and vegetable juices. Fruits Dried fruits. Canned fruit in light or heavy syrup. Fruit juice. Meat and Other Protein Products Fatty cuts of meat. Ribs, chicken wings, bacon, sausage, bologna, salami, chitterlings, fatback, hot dogs, bratwurst, and packaged luncheon meats. Salted nuts and seeds. Canned beans with salt. Dairy Whole or 2% milk, cream, half-and-half, and cream cheese. Whole-fat or sweetened yogurt. Full-fat cheeses or blue cheese. Nondairy creamers and whipped toppings. Processed cheese, cheese spreads, or cheese curds. Condiments Onion and garlic salt, seasoned salt, table salt, and sea salt. Canned and packaged gravies. Worcestershire sauce. Tartar sauce. Barbecue sauce. Teriyaki sauce. Soy sauce, including reduced sodium. Steak sauce. Fish sauce. Oyster sauce. Cocktail sauce. Horseradish. Ketchup and mustard. Meat flavorings and tenderizers. Bouillon cubes. Hot sauce. Tabasco sauce. Marinades. Taco seasonings. Relishes. Fats and Oils Butter, stick margarine, lard,  shortening, ghee, and bacon fat. Coconut, palm kernel, or palm oils. Regular salad dressings. Other Pickles and olives. Salted popcorn and pretzels. The items listed above may not be a complete list of foods and beverages to avoid. Contact your dietitian for more information. WHERE CAN I FIND MORE INFORMATION? National Heart, Lung, and Blood Institute: www.nhlbi.nih.gov/health/health-topics/topics/dash/ Document Released: 11/02/2011 Document Revised: 03/30/2014 Document Reviewed: 09/17/2013 [employer] Patient Information 2015 [employer]. This information is not intended to replace advice given to you by your health care provider. Make sure you discuss any questions you have with your health care provider.  

## 2015-05-21 NOTE — Progress Notes (Signed)
Per PCP: D/c HCTZ D/c Naproxen Toradol 30 mg IM now Tramadol 50 mg po bid prn #30 with NR  Patient to return in 2 weeks for nurse visit for BP check  Discussed need for low sodium diet and using Mrs. Dash as alternative to salt  Patient given literature on Hypertension and DASH Eating Plan

## 2015-05-23 NOTE — Assessment & Plan Note (Signed)
Currently asymptomatic. -Following with PCP to stabilize. If develops symptomatic HTN such as HA, blurry vision, shortness of breath, or chest pain, instructed to call or go to the ED.

## 2015-05-23 NOTE — Progress Notes (Signed)
   Subjective:    Patient ID: Kathleen Stout, female    DOB: 1971/05/20, 44 y.o.   MRN: [id number]  HPI Kathleen Stout is a 44 year old female who presents for evaluation of left knee pain. She has had chronic left knee pain for many months. She denies any acute injury. She has tried taking extra strength Tylenol and tramadol. She tries to remain somewhat active, but has been limited due to the left knee pain. She notes intermittent swelling, catching, and pain. Occasionally her pain wakes her up at night. She denies any true locking or giving way. Her history is complicated by morbid obesity, prediabetes, and hypertension.  X-rays of the left knee were reviewed from 04/19/15 which show moderate medial compartment and patellofemoral osteoarthritis of the left knee.   Today, her blood pressure was also elevated. The size of the cuff made accurate measurement somewhat difficult. She denied any symptoms of headache, dizziness, chest pain, or shortness of breath. She has a follow-up visit next week with her primary care office for her blood pressure.  Past medical history, social history, medications, and allergies were reviewed and are up to date in the chart. Review of Systems 7 point review of systems was performed and was otherwise negative unless noted in the history of present illness.     Objective:   Physical Exam BP 156/116 mmHg  Pulse 82  Ht 5\' 4"  (1.626 m)  Wt 320 lb (145.151 kg)  BMI 54.90 kg/m2 GEN: The patient is well-developed well-nourished female and in no acute distress.  She is awake alert and oriented x3. SKIN: warm and well-perfused, no rash  EXTR: No lower extremity edema or calf tenderness Neuro: Strength 5/5 globally. Sensation intact throughout. No focal deficits. Vasc: +2 bilateral distal pulses.  MSK: Examination left knee reveals range of motion from 0-115 with mild pain at the endpoint of flexion. Medial joint line tenderness to palpation. Positive patellar grind test.  Trace effusion. No instability with valgus or varus testing. No overlying erythema, warmth, or induration.     Assessment & Plan:  Please see problem based assessment and plan in the problem list.

## 2015-05-23 NOTE — Assessment & Plan Note (Addendum)
Treatment options were discussed. This is somewhat complicated due to the patient's morbid obesity and currently uncontrolled hypertension. -The patient's laboratory work was reviewed which showed fairly normal renal function. -We had recommended that the patient start naproxen and hydrochlorothiazide, but it appears she is going to hold off on this at the recommendation of her primary care physician. Hopefully, once her blood pressure is stabilized, she would likely benefit temporarily from a cortical started injection. -We had a long discussion regarding weight loss strategies, as if her osteoarthritis continues to progress she would not currently be a candidate for a knee replacement surgery. -We discussed a low impact exercise program, and it appears she is receiving further counseling regarding dietary management for her blood pressure and weight loss strategies. -In the interim it appears as though she has been started on tramadol by her PCP. -We will plan on seeing her back in 2-4 weeks for repeat evaluation. She may call sooner if needed.

## 2015-06-16 ENCOUNTER — Ambulatory Visit: Payer: Self-pay | Attending: Internal Medicine | Admitting: *Deleted

## 2015-06-16 ENCOUNTER — Encounter: Payer: Self-pay | Admitting: Sports Medicine

## 2015-06-16 ENCOUNTER — Ambulatory Visit (INDEPENDENT_AMBULATORY_CARE_PROVIDER_SITE_OTHER): Payer: Self-pay | Admitting: Sports Medicine

## 2015-06-16 VITALS — BP 137/75 | HR 82 | Temp 97.9°F | Resp 22 | Ht 64.0 in | Wt 309.4 lb

## 2015-06-16 VITALS — BP 148/100 | HR 74 | Ht 64.0 in | Wt 320.0 lb

## 2015-06-16 DIAGNOSIS — I10 Essential (primary) hypertension: Secondary | ICD-10-CM | POA: Insufficient documentation

## 2015-06-16 DIAGNOSIS — M25562 Pain in left knee: Secondary | ICD-10-CM | POA: Insufficient documentation

## 2015-06-16 MED ORDER — METHYLPREDNISOLONE ACETATE 40 MG/ML IJ SUSP
40.0000 mg | Freq: Once | INTRAMUSCULAR | Status: AC
  Administered 2015-06-16: 40 mg via INTRA_ARTICULAR

## 2015-06-16 NOTE — Progress Notes (Signed)
Patient presents for BP check after increasing lisinopril to 10 mg daily. Took at 0900 this AM Med list reviewed; states taking all meds as directed Discussed need for low sodium diet and using Mrs. Dash as alternative to salt Encouraged to choose foods with 5% or less of daily value for sodium. Discussed walking 30 minutes per day for exercise Patient denies headaches, blurred vision, SHOB, chest pain or pressure  Filed Vitals:   06/16/15 1415  BP: 137/75  Pulse: 82  Temp: 97.9 F (36.6 C)  Resp: 22     Patient advised to call for med refills at least 7 days before running out so as not to go without.  Patient aware that she is to f/u with PCP 3 months from last visit (Due 08/04/15)  Patient given literature on DASH Eating Plan

## 2015-06-16 NOTE — Progress Notes (Signed)
Subjective:     Patient ID: Kathleen Stout, female   DOB: 01/15/1971, 44 y.o.   MRN: [id number]  HPI Kathleen Stout is a 44 yo female with morbid obesity and HTN presenting for follow up of left knee pain. She has had pain in her medial knee for the past three months that has limited her activity. It has improved slightly since her last appointment a month ago. She denies acute trauma. She reports locking and stiffness of her knee when she lies down. She denies clicking, catching, swelling.   She reports that she has lost 7 pounds since taking metformin (started on 05/04/15 after HbA1c was 6.7).  She recently increased her lisinopril and reports that her BP has improved. Review of Systems Per HPI.    Objective:   Physical Exam Gen: morbidly obese female, NAD BP 148/100 mmHg  Pulse 74  Ht 5\' 4"  (1.626 m)  Wt 320 lb (145.151 kg)  BMI 54.90 kg/m2  MSK: Left knee:  No swelling, erythema. ROM with full extension, flexion reduced by size. Posterior knee pain with extension. Tenderness with palpation over medial joint line. Positive patellar compression. Good strength with flexion, extension of knee. Pain but no instability with valgus stress. No pain with varus stress. Pain but no clicking with Mcmurray's test. Positive thessaly test. Lower extremity neurovascularly intact.     X ray on 04/19/15 showed mild loss of joint space in medial left knee. Assessment:     Osteoarthritis of left knee, complicated by morbid obesity.    Plan:     - We discussed the importance of weight loss in improving her symptoms and overall health - Corticosteroid injection in left knee today - If pain does not improve, consider further diagnostic imaging     Patient seen and evaluated with the medical student. I agree with the above plan of care. I administered a cortisone injection into her left knee today utilizing an anterior lateral approach. She tolerated this without difficulty. There was some concern about  the cortisone injection resulting in worsening hypertension but if that is the case it will be very transient. She is following up with her PCP later today for her elevated blood pressure. Follow-up with us as needed.  Consent obtained and verified. Time-out conducted. Noted no overlying erythema, induration, or other signs of local infection. Skin prepped in a sterile fashion. Topical analgesic spray: Ethyl chloride. Joint: left knee Needle: 22g 1.5 inch Completed without difficulty. Meds: 3cc 1% xylocaine, 1cc (40mg ) depomedrol  Advised to call if fevers/chills, erythema, induration, drainage, or persistent bleeding.

## 2015-06-16 NOTE — Patient Instructions (Signed)
DASH Eating Plan °DASH stands for "Dietary Approaches to Stop Hypertension." The DASH eating plan is a healthy eating plan that has been shown to reduce high blood pressure (hypertension). Additional health benefits may include reducing the risk of type 2 diabetes mellitus, heart disease, and stroke. The DASH eating plan may also help with weight loss. °WHAT DO I NEED TO KNOW ABOUT THE DASH EATING PLAN? °For the DASH eating plan, you will follow these general guidelines: °· Choose foods with a percent daily value for sodium of less than 5% (as listed on the food label). °· Use salt-free seasonings or herbs instead of table salt or sea salt. °· Check with your health care provider or pharmacist before using salt substitutes. °· Eat lower-sodium products, often labeled as "lower sodium" or "no salt added." °· Eat fresh foods. °· Eat more vegetables, fruits, and low-fat dairy products. °· Choose whole grains. Look for the word "whole" as the first word in the ingredient list. °· Choose fish and skinless chicken or turkey more often than red meat. Limit fish, poultry, and meat to 6 oz (170 g) each day. °· Limit sweets, desserts, sugars, and sugary drinks. °· Choose heart-healthy fats. °· Limit cheese to 1 oz (28 g) per day. °· Eat more home-cooked food and less restaurant, buffet, and fast food. °· Limit fried foods. °· Cook foods using methods other than frying. °· Limit canned vegetables. If you do use them, rinse them well to decrease the sodium. °· When eating at a restaurant, ask that your food be prepared with less salt, or no salt if possible. °WHAT FOODS CAN I EAT? °Seek help from a dietitian for individual calorie needs. °Grains °Whole grain or whole wheat bread. Brown rice. Whole grain or whole wheat pasta. Quinoa, bulgur, and whole grain cereals. Low-sodium cereals. Corn or whole wheat flour tortillas. Whole grain cornbread. Whole grain crackers. Low-sodium crackers. °Vegetables °Fresh or frozen vegetables  (raw, steamed, roasted, or grilled). Low-sodium or reduced-sodium tomato and vegetable juices. Low-sodium or reduced-sodium tomato sauce and paste. Low-sodium or reduced-sodium canned vegetables.  °Fruits °All fresh, canned (in natural juice), or frozen fruits. °Meat and Other Protein Products °Ground beef (85% or leaner), grass-fed beef, or beef trimmed of fat. Skinless chicken or turkey. Ground chicken or turkey. Pork trimmed of fat. All fish and seafood. Eggs. Dried beans, peas, or lentils. Unsalted nuts and seeds. Unsalted canned beans. °Dairy °Low-fat dairy products, such as skim or 1% milk, 2% or reduced-fat cheeses, low-fat ricotta or cottage cheese, or plain low-fat yogurt. Low-sodium or reduced-sodium cheeses. °Fats and Oils °Tub margarines without trans fats. Light or reduced-fat mayonnaise and salad dressings (reduced sodium). Avocado. Safflower, olive, or canola oils. Natural peanut or almond butter. °Other °Unsalted popcorn and pretzels. °The items listed above may not be a complete list of recommended foods or beverages. Contact your dietitian for more options. °WHAT FOODS ARE NOT RECOMMENDED? °Grains °White bread. White pasta. White rice. Refined cornbread. Bagels and croissants. Crackers that contain trans fat. °Vegetables °Creamed or fried vegetables. Vegetables in a cheese sauce. Regular canned vegetables. Regular canned tomato sauce and paste. Regular tomato and vegetable juices. °Fruits °Dried fruits. Canned fruit in light or heavy syrup. Fruit juice. °Meat and Other Protein Products °Fatty cuts of meat. Ribs, chicken wings, bacon, sausage, bologna, salami, chitterlings, fatback, hot dogs, bratwurst, and packaged luncheon meats. Salted nuts and seeds. Canned beans with salt. °Dairy °Whole or 2% milk, cream, half-and-half, and cream cheese. Whole-fat or sweetened yogurt. Full-fat   cheeses or blue cheese. Nondairy creamers and whipped toppings. Processed cheese, cheese spreads, or cheese  curds. °Condiments °Onion and garlic salt, seasoned salt, table salt, and sea salt. Canned and packaged gravies. Worcestershire sauce. Tartar sauce. Barbecue sauce. Teriyaki sauce. Soy sauce, including reduced sodium. Steak sauce. Fish sauce. Oyster sauce. Cocktail sauce. Horseradish. Ketchup and mustard. Meat flavorings and tenderizers. Bouillon cubes. Hot sauce. Tabasco sauce. Marinades. Taco seasonings. Relishes. °Fats and Oils °Butter, stick margarine, lard, shortening, ghee, and bacon fat. Coconut, palm kernel, or palm oils. Regular salad dressings. °Other °Pickles and olives. Salted popcorn and pretzels. °The items listed above may not be a complete list of foods and beverages to avoid. Contact your dietitian for more information. °WHERE CAN I FIND MORE INFORMATION? °National Heart, [employer], and [employer] Institute: www.nhlbi.nih.gov/health/health-topics/topics/dash/ °Document Released: 11/02/2011 Document Revised: 03/30/2014 Document Reviewed: 09/17/2013 °ExitCare® Patient Information ©2015 [employer]. This information is not intended to replace advice given to you by your health care provider. Make sure you discuss any questions you have with your health care provider. ° °

## 2015-07-30 ENCOUNTER — Other Ambulatory Visit: Payer: Self-pay | Admitting: Pharmacist

## 2015-07-30 ENCOUNTER — Encounter: Payer: Self-pay | Admitting: Internal Medicine

## 2015-07-30 ENCOUNTER — Ambulatory Visit: Payer: Self-pay | Attending: Internal Medicine | Admitting: Internal Medicine

## 2015-07-30 VITALS — BP 144/90 | HR 65 | Temp 98.0°F | Resp 18 | Ht 64.0 in | Wt 309.4 lb

## 2015-07-30 DIAGNOSIS — E119 Type 2 diabetes mellitus without complications: Secondary | ICD-10-CM

## 2015-07-30 DIAGNOSIS — E1169 Type 2 diabetes mellitus with other specified complication: Secondary | ICD-10-CM

## 2015-07-30 DIAGNOSIS — I10 Essential (primary) hypertension: Secondary | ICD-10-CM

## 2015-07-30 DIAGNOSIS — Z6841 Body Mass Index (BMI) 40.0 and over, adult: Secondary | ICD-10-CM

## 2015-07-30 DIAGNOSIS — E669 Obesity, unspecified: Secondary | ICD-10-CM

## 2015-07-30 LAB — POCT GLYCOSYLATED HEMOGLOBIN (HGB A1C)

## 2015-07-30 LAB — GLUCOSE, POCT (MANUAL RESULT ENTRY): POC Glucose: 84 mg/dl (ref 70–99)

## 2015-07-30 MED ORDER — TRUEPLUS LANCETS 28G MISC: Status: DC

## 2015-07-30 MED ORDER — GLUCOSE BLOOD VI STRP: ORAL_STRIP | Status: DC

## 2015-07-30 MED ORDER — TRUE METRIX METER DEVI: 1.0000 | Status: DC

## 2015-07-30 MED ORDER — METFORMIN HCL 500 MG PO TABS: 500.0000 mg | ORAL_TABLET | Freq: Two times a day (BID) | ORAL | Status: DC

## 2015-07-30 MED ORDER — ACCU-CHEK SOFT TOUCH LANCETS MISC: Status: DC

## 2015-07-30 MED ORDER — ACCU-CHEK AVIVA DEVI: Status: DC

## 2015-07-30 MED ORDER — LISINOPRIL 20 MG PO TABS: 20.0000 mg | ORAL_TABLET | Freq: Every day | ORAL | Status: DC

## 2015-07-30 NOTE — Progress Notes (Signed)
Patient ID: Kathleen Stout, female   DOB: Apr 04, 1971, 44 y.o.   MRN: [id number]  CC: DM/HTN follow up   HPI: Kathleen Stout is a 44 y.o. female here today for a follow up visit for diabetes and hypertension.  Patient has past medical history of morbid obesity, hypertension, diabetes. Patient reports that she has been using Metformin and lisinopril as directed since our last visit and notices that she has lost 11 pounds since our last visit. She admits to continuing to eat a high fat and high sugar diet. She does not check her sugars or blood pressures at home. She has not begun a exercise regimen yet but plans to in the near future. Patient denies diarrhea, abdominal pain, or cough from medication use. She denies any further complaints today.   Patient has No headache, No chest pain, No abdominal pain - No Nausea, No new weakness tingling or numbness, No Cough - SOB.  No Known Allergies Past Medical History  Diagnosis Date  . Hypertension   . Anemia   . Arthritis    Current Outpatient Prescriptions on File Prior to Visit  Medication Sig Dispense Refill  . lisinopril (PRINIVIL,ZESTRIL) 10 MG tablet Take 1 tablet (10 mg total) by mouth daily. 30 tablet 2  . metFORMIN (GLUCOPHAGE) 500 MG tablet Take 1 tablet (500 mg total) by mouth 2 (two) times daily with a meal. 60 tablet 4  . traMADol (ULTRAM) 50 MG tablet Take 1 tablet (50 mg total) by mouth 2 (two) times daily. As needed for pain (Patient not taking: Reported on 07/30/2015) 30 tablet 0   No current facility-administered medications on file prior to visit.   Family History  Problem Relation Age of Onset  . Diabetes Mother   . Hypertension Mother   . Hyperlipidemia Mother   . Heart disease Mother    Social History   Social History  . Marital Status: Single    Spouse Name: N/A  . Number of Children: N/A  . Years of Education: N/A   Occupational History  . Not on file.   Social History Main Topics  . Smoking status: Never Smoker    . Smokeless tobacco: Not on file  . Alcohol Use: No  . Drug Use: No  . Sexual Activity: Not on file   Other Topics Concern  . Not on file   Social History Narrative    Review of Systems: Other than what is stated in HPI, all other systems are negative.   Objective:   Filed Vitals:   07/30/15 1503  BP: 144/90  Pulse: 65  Temp: 98 F (36.7 C)  Resp: 18    Physical Exam  Constitutional: She is oriented to person, place, and time.  Neck: No JVD present. No thyromegaly present.  Cardiovascular: Normal rate, regular rhythm and normal heart sounds.   No murmur heard. Pulses:      Dorsalis pedis pulses are 2+ on the right side, and 2+ on the left side.       Posterior tibial pulses are 2+ on the right side, and 2+ on the left side.  Pulmonary/Chest: Effort normal and breath sounds normal.  Musculoskeletal: She exhibits no edema.  Feet:  Right Foot:  Protective Sensation: 10 sites tested.10 sites sensed. Skin Integrity: Negative for ulcer or skin breakdown.  Left Foot:  Protective Sensation: 10 sites tested. 10 sites sensed. Skin Integrity: Negative for ulcer or skin breakdown.  Neurological: She is alert and oriented to person, place, and time.  Skin: Skin is warm and dry.     Lab Results  Component Value Date   WBC 8.0 04/20/2015   HGB 14.6 04/20/2015   HCT 43.6 04/20/2015   MCV 87.7 04/20/2015   PLT 251 04/20/2015   Lab Results  Component Value Date   CREATININE 0.86 04/20/2015   BUN 12 04/20/2015   NA 137 04/20/2015   K 3.9 04/20/2015   CL 105 04/20/2015   CO2 26 04/20/2015    Lab Results  Component Value Date   HGBA1C 6.70 05/04/2015   Lipid Panel     Component Value Date/Time   CHOL 179 04/20/2015 1505   TRIG 102 04/20/2015 1505   HDL 48 04/20/2015 1505   CHOLHDL 3.7 04/20/2015 1505   VLDL 20 04/20/2015 1505   LDLCALC 111* 04/20/2015 1505       Assessment and plan:   Elliott was seen today for follow-up.  Diagnoses and all orders for  this visit:  Diabetes mellitus type 2 in obese -     Glucose (CBG) -     HgB A1c -     Refilled metFORMIN (GLUCOPHAGE) 500 MG tablet; Take 1 tablet (500 mg total) by mouth 2 (two) times daily with a meal. -     Microalbumin, urine Glucometer prescribed today.  Patients diabetes is currently controlled, I have explained to patient the more when she loses the better diabetes will be control. I have explained that it is very likely that she may eventually be able to stop taking medication once she loses a substantial amount of weight. I have went over proper diet, foot care, eye care, dental care as a diabetic. Patient handouts given on diet and sugar logs provided.   Essential hypertension -    Increased lisinopril (PRINIVIL,ZESTRIL) 20 MG tablet; Take 1 tablet (20 mg total) by mouth daily. Again I stressed weight loss and exercise in order to help with BP, currently not at goal. I have increased her lisinopril to give her better control. Dash diet advised.   Morbid obesity with BMI of 50.0-59.9, adult I will keep patient on Metformin which I believe is helping her to lose some weight considering she has not made lifestyle changes or diet changes and has lost over 10 pounds in past 3 months. I have congratulated patient on weight loss and we discussed  ways to help her be successful in weight loss journey. We went over specific diet plans and exercises plans that will help increase her metabolism. We have went over the expectation of another 10 pounds weight loss in next 3 months. I will send nutrition referral once patient receives hospital discount.    Total time spent with patient was 25 minutes. > 50% spent counseling, educating on health risk, and coordination care with patient.  Return in about 3 months (around 10/29/2015) for DM/HTN.    Ambrose Finland, NP-C [facility] [phone] 07/30/2015, 3:25 PM

## 2015-07-30 NOTE — Progress Notes (Signed)
Pt here for 3 month F/U for HTN. Pt denies any pain today. Pt has taken her medications for today. Pt does not need any refills on medication today.

## 2015-07-30 NOTE — Patient Instructions (Addendum)
When you get hospital discount call me and I will send order for diabetes and nutrition class. Continue to keep up the good work, you are going in the right direction with weight loss.   Diabetes Mellitus and Food It is important for you to manage your blood sugar (glucose) level. Your blood glucose level can be greatly affected by what you eat. Eating healthier foods in the appropriate amounts throughout the day at about the same time each day will help you control your blood glucose level. It can also help slow or prevent worsening of your diabetes mellitus. Healthy eating may even help you improve the level of your blood pressure and reach or maintain a healthy weight.  HOW CAN FOOD AFFECT ME? Carbohydrates Carbohydrates affect your blood glucose level more than any other type of food. Your dietitian will help you determine how many carbohydrates to eat at each meal and teach you how to count carbohydrates. Counting carbohydrates is important to keep your blood glucose at a healthy level, especially if you are using insulin or taking certain medicines for diabetes mellitus. Alcohol Alcohol can cause sudden decreases in blood glucose (hypoglycemia), especially if you use insulin or take certain medicines for diabetes mellitus. Hypoglycemia can be a life-threatening condition. Symptoms of hypoglycemia (sleepiness, dizziness, and disorientation) are similar to symptoms of having too much alcohol.  If your health care provider has given you approval to drink alcohol, do so in moderation and use the following guidelines:  Women should not have more than one drink per day, and men should not have more than two drinks per day. One drink is equal to:  12 oz of beer.  5 oz of wine.  1 oz of hard liquor.  Do not drink on an empty stomach.  Keep yourself hydrated. Have water, diet soda, or unsweetened iced tea.  Regular soda, juice, and other mixers might contain a lot of carbohydrates and should be  counted. WHAT FOODS ARE NOT RECOMMENDED? As you make food choices, it is important to remember that all foods are not the same. Some foods have fewer nutrients per serving than other foods, even though they might have the same number of calories or carbohydrates. It is difficult to get your body what it needs when you eat foods with fewer nutrients. Examples of foods that you should avoid that are high in calories and carbohydrates but low in nutrients include:  Trans fats (most processed foods list trans fats on the Nutrition Facts label).  Regular soda.  Juice.  Candy.  Sweets, such as cake, pie, doughnuts, and cookies.  Fried foods. WHAT FOODS CAN I EAT? Have nutrient-rich foods, which will nourish your body and keep you healthy. The food you should eat also will depend on several factors, including:  The calories you need.  The medicines you take.  Your weight.  Your blood glucose level.  Your blood pressure level.  Your cholesterol level. You also should eat a variety of foods, including:  Protein, such as meat, poultry, fish, tofu, nuts, and seeds (lean animal proteins are best).  Fruits.  Vegetables.  Dairy products, such as milk, cheese, and yogurt (low fat is best).  Breads, grains, pasta, cereal, rice, and beans.  Fats such as olive oil, trans fat-free margarine, canola oil, avocado, and olives. DOES EVERYONE WITH DIABETES MELLITUS HAVE THE SAME MEAL PLAN? Because every person with diabetes mellitus is different, there is not one meal plan that works for everyone. It is very important  that you meet with a dietitian who will help you create a meal plan that is just right for you. Document Released: 08/10/2005 Document Revised: 11/18/2013 Document Reviewed: 10/10/2013 [facility] Patient Information 2015 [place], [place]. This information is not intended to replace advice given to you by your health care provider. Make sure you discuss any questions you have with your  health care provider.

## 2015-08-02 DIAGNOSIS — Z6841 Body Mass Index (BMI) 40.0 and over, adult: Secondary | ICD-10-CM

## 2015-08-02 DIAGNOSIS — E1169 Type 2 diabetes mellitus with other specified complication: Secondary | ICD-10-CM | POA: Insufficient documentation

## 2015-08-02 DIAGNOSIS — E669 Obesity, unspecified: Principal | ICD-10-CM | POA: Insufficient documentation

## 2015-08-09 ENCOUNTER — Other Ambulatory Visit: Payer: Self-pay | Admitting: Internal Medicine

## 2015-08-10 ENCOUNTER — Other Ambulatory Visit: Payer: Self-pay | Admitting: *Deleted

## 2015-08-10 DIAGNOSIS — M25562 Pain in left knee: Secondary | ICD-10-CM

## 2015-08-10 MED ORDER — TRAMADOL HCL 50 MG PO TABS: 50.0000 mg | ORAL_TABLET | Freq: Two times a day (BID) | ORAL | Status: DC

## 2015-08-11 ENCOUNTER — Telehealth: Payer: Self-pay

## 2015-08-11 ENCOUNTER — Encounter: Payer: Self-pay | Admitting: Internal Medicine

## 2015-08-11 ENCOUNTER — Encounter (HOSPITAL_COMMUNITY): Payer: Self-pay | Admitting: Emergency Medicine

## 2015-08-11 ENCOUNTER — Emergency Department (HOSPITAL_COMMUNITY)
Admission: EM | Admit: 2015-08-11 | Discharge: 2015-08-11 | Disposition: A | Discharge status: Home / Self Care | Payer: Self-pay | Attending: Emergency Medicine | Admitting: Emergency Medicine

## 2015-08-11 ENCOUNTER — Telehealth: Payer: Self-pay | Admitting: Internal Medicine

## 2015-08-11 DIAGNOSIS — I10 Essential (primary) hypertension: Secondary | ICD-10-CM | POA: Insufficient documentation

## 2015-08-11 DIAGNOSIS — M79675 Pain in left toe(s): Secondary | ICD-10-CM | POA: Insufficient documentation

## 2015-08-11 DIAGNOSIS — Z862 Personal history of diseases of the blood and blood-forming organs and certain disorders involving the immune mechanism: Secondary | ICD-10-CM | POA: Insufficient documentation

## 2015-08-11 DIAGNOSIS — Z79899 Other long term (current) drug therapy: Secondary | ICD-10-CM | POA: Insufficient documentation

## 2015-08-11 DIAGNOSIS — E119 Type 2 diabetes mellitus without complications: Secondary | ICD-10-CM | POA: Insufficient documentation

## 2015-08-11 NOTE — ED Notes (Signed)
Pt states Left great toe nail is falling off-- and she is a diabetic, concerned about infection. Toenail intact, but c/o throbbing.

## 2015-08-11 NOTE — ED Notes (Signed)
Pt states concern due to her being a diabetic

## 2015-08-11 NOTE — Discharge Instructions (Signed)
It is important for you to follow-up with your PCP, [facility], for further evaluation and management of your symptoms. There does not appear to be an emergent cause for your toe pain at this time. Return to ED for new or worsening symptoms.

## 2015-08-11 NOTE — Telephone Encounter (Signed)
Patient called requesting to speak to nurse regarding toe injury, please f/u with patient

## 2015-08-11 NOTE — ED Provider Notes (Signed)
CSN: [id number]     Arrival date & time 08/11/15  1506 History  This chart was scribed for non-physician practitioner Comer Locket, PA-C, working with Orlie Dakin, MD, by Eustaquio Maize, ED Scribe. This patient was seen in room TR07C/TR07C and the patient's care was started at 4:10 PM.  Chief Complaint  Patient presents with  . Nail Problem   The history is provided by the patient. No language interpreter was used.     HPI Comments: Kathleen Stout is a 44 y.o. female with hx DM who presents to the Emergency Department complaining of sudden onset, sharp and stinging, 7/10, right great toe pain x 1 day. Pt reports half of the toenail fell off ,causing the pain. She mentions that the other half of the nail feels like it is about to come off as well. Pt also complains of numbness to the area. She denies fever, chills, or any other associated symptoms.   PCP - Feliciana Rossetti   Past Medical History  Diagnosis Date  . Hypertension   . Anemia   . Arthritis   . Diabetes mellitus without complication    Past Surgical History  Procedure Laterality Date  . Abdominal hysterectomy     Family History  Problem Relation Age of Onset  . Diabetes Mother   . Hypertension Mother   . Hyperlipidemia Mother   . Heart disease Mother    Social History  Substance Use Topics  . Smoking status: Never Smoker   . Smokeless tobacco: None  . Alcohol Use: No   OB History    No data available     Review of Systems  Constitutional: Negative for fever and chills.  Musculoskeletal: Positive for arthralgias (Left great toe pain).  Neurological: Positive for numbness.  All other systems reviewed and are negative.  Allergies  Review of patient's allergies indicates no known allergies.  Home Medications   Prior to Admission medications   Medication Sig Start Date End Date Taking? Authorizing Provider  Blood Glucose Monitoring Suppl (TRUE METRIX METER) DEVI 1 kit by Does not apply route as directed. 07/30/15    Lance Bosch, NP  glucose blood (TRUE METRIX BLOOD GLUCOSE TEST) test strip Use as instructed 07/30/15   Lance Bosch, NP  lisinopril (PRINIVIL,ZESTRIL) 20 MG tablet Take 1 tablet (20 mg total) by mouth daily. 07/30/15   Lance Bosch, NP  metFORMIN (GLUCOPHAGE) 500 MG tablet Take 1 tablet (500 mg total) by mouth 2 (two) times daily with a meal. 07/30/15   Lance Bosch, NP  traMADol (ULTRAM) 50 MG tablet Take 1 tablet (50 mg total) by mouth 2 (two) times daily. As needed for pain 08/10/15   Thurman Coyer, DO  TRUEPLUS LANCETS 28G MISC Use as directed 07/30/15   Lance Bosch, NP   Triage Vitals: BP 171/96 mmHg  Pulse 78  Temp(Src) 97.7 F (36.5 C) (Oral)  Resp 16  Ht $R'5\' 4"'ng$  (1.626 m)  Wt 309 lb (140.161 kg)  BMI 53.01 kg/m2  SpO2 96%   Physical Exam  Constitutional: She is oriented to person, place, and time. She appears well-developed and well-nourished. No distress.  HENT:  Head: Normocephalic and atraumatic.  Eyes: Conjunctivae and EOM are normal.  Neck: Neck supple. No tracheal deviation present.  Cardiovascular: Normal rate.   Pulmonary/Chest: Effort normal. No respiratory distress.  Musculoskeletal: Normal range of motion.  Neurological: She is alert and oriented to person, place, and time.  Motor and sensation are baseline for patient.  Sensation is decreased in left great toe secondary to diabetic neuropathy, baseline for patient.  Skin: Skin is warm and dry.  Pain to distal end of left great toe nail bed. Small portion of nail is missing exposing small area of nail bed. Toe is without any gross erythema, edema, warmth. No evidence of paronychia or felon. Distal pulses intact.  Psychiatric: She has a normal mood and affect. Her behavior is normal.  Nursing note and vitals reviewed.   ED Course  Procedures (including critical care time)  DIAGNOSTIC STUDIES: Oxygen Saturation is 96% on RA, normal by my interpretation.    COORDINATION OF CARE: 4:16 PM-Discussed  treatment plan  with pt at bedside and pt agreed to plan.   Labs Review Labs Reviewed - No data to display  Imaging Review No results found.    EKG Interpretation None     Filed Vitals:   08/11/15 1516 08/11/15 1622  BP: 171/96 170/82  Pulse: 78 76  Temp: 97.7 F (36.5 C) 98.1 F (36.7 C)  TempSrc: Oral Oral  Resp: 16 18  Height: $Remove'5\' 4"'DYLDwGK$  (1.626 m)   Weight: 309 lb (140.161 kg)   SpO2: 96% 100%    MDM  [name] is a 44 y.o. female with history of diabetes comes in for evaluation of left great toenail avulsion (partial).  Vitals stable -afebrile Pt resting comfortably in ED. PE--physical exam as above. Suspect discomfort is due to exposed nailbed and not necessarily to infection. No evidence of felon or paronychia. Patient does have access to primary care, discussed follow-up with PCP this week for reevaluation. Patient reports she will call PCP today for appointment. No evidence of other acute or emergent pathology at this time.  I discussed all relevant lab findings and imaging results with pt and they verbalized understanding. Discussed f/u with PCP within 48 hrs and return precautions, pt very amenable to plan.  Final diagnoses:  Great toe pain, left   I personally performed the services described in this documentation, which was scribed in my presence. The recorded information has been reviewed and is accurate.      Comer Locket, PA-C 08/11/15 1732  Orlie Dakin, MD 08/11/15 [phone]

## 2015-08-11 NOTE — Telephone Encounter (Signed)
Returned patient phone call Patient not available Left message on voice mail to return our call 

## 2015-08-17 ENCOUNTER — Ambulatory Visit: Payer: Self-pay | Admitting: Family Medicine

## 2015-08-18 ENCOUNTER — Encounter (HOSPITAL_COMMUNITY): Payer: Self-pay | Admitting: *Deleted

## 2015-08-18 ENCOUNTER — Other Ambulatory Visit: Payer: Self-pay

## 2015-08-18 ENCOUNTER — Emergency Department (HOSPITAL_COMMUNITY): Payer: Self-pay

## 2015-08-18 ENCOUNTER — Ambulatory Visit (HOSPITAL_COMMUNITY)
Admission: RE | Admit: 2015-08-18 | Discharge: 2015-08-18 | Disposition: A | Discharge status: Home / Self Care | Payer: Self-pay | Source: Ambulatory Visit | Attending: Cardiology | Admitting: Cardiology

## 2015-08-18 ENCOUNTER — Encounter: Payer: Self-pay | Admitting: Internal Medicine

## 2015-08-18 ENCOUNTER — Ambulatory Visit: Payer: Self-pay | Attending: Internal Medicine | Admitting: Internal Medicine

## 2015-08-18 ENCOUNTER — Emergency Department (HOSPITAL_COMMUNITY)
Admission: EM | Admit: 2015-08-18 | Discharge: 2015-08-18 | Disposition: A | Discharge status: Home / Self Care | Payer: Self-pay | Attending: Emergency Medicine | Admitting: Emergency Medicine

## 2015-08-18 VITALS — BP 185/118 | HR 88 | Temp 97.7°F | Resp 16 | Ht 64.0 in | Wt 304.0 lb

## 2015-08-18 DIAGNOSIS — IMO0001 Reserved for inherently not codable concepts without codable children: Secondary | ICD-10-CM

## 2015-08-18 DIAGNOSIS — R42 Dizziness and giddiness: Secondary | ICD-10-CM | POA: Insufficient documentation

## 2015-08-18 DIAGNOSIS — R079 Chest pain, unspecified: Secondary | ICD-10-CM | POA: Insufficient documentation

## 2015-08-18 DIAGNOSIS — R0602 Shortness of breath: Secondary | ICD-10-CM | POA: Insufficient documentation

## 2015-08-18 DIAGNOSIS — Z862 Personal history of diseases of the blood and blood-forming organs and certain disorders involving the immune mechanism: Secondary | ICD-10-CM | POA: Insufficient documentation

## 2015-08-18 DIAGNOSIS — R030 Elevated blood-pressure reading, without diagnosis of hypertension: Secondary | ICD-10-CM | POA: Insufficient documentation

## 2015-08-18 DIAGNOSIS — E119 Type 2 diabetes mellitus without complications: Secondary | ICD-10-CM | POA: Insufficient documentation

## 2015-08-18 DIAGNOSIS — R110 Nausea: Secondary | ICD-10-CM | POA: Insufficient documentation

## 2015-08-18 DIAGNOSIS — I10 Essential (primary) hypertension: Secondary | ICD-10-CM | POA: Insufficient documentation

## 2015-08-18 DIAGNOSIS — Z8739 Personal history of other diseases of the musculoskeletal system and connective tissue: Secondary | ICD-10-CM | POA: Insufficient documentation

## 2015-08-18 DIAGNOSIS — Z79899 Other long term (current) drug therapy: Secondary | ICD-10-CM | POA: Insufficient documentation

## 2015-08-18 LAB — BASIC METABOLIC PANEL
Anion gap: 8 (ref 5–15)
BUN: 9 mg/dL (ref 6–20)
CALCIUM: 8.8 mg/dL — AB (ref 8.9–10.3)
CO2: 26 mmol/L (ref 22–32)
CREATININE: 0.92 mg/dL (ref 0.44–1.00)
Chloride: 103 mmol/L (ref 101–111)
GFR calc Af Amer: >60 mL/min (ref >60)
GLUCOSE: 117 mg/dL — AB (ref 65–99)
POTASSIUM: 3.4 mmol/L — AB (ref 3.5–5.1)
SODIUM: 137 mmol/L (ref 135–145)

## 2015-08-18 LAB — I-STAT TROPONIN, ED
TROPONIN I, POC: 0 ng/mL (ref 0.00–0.08)
Troponin i, poc: 0 ng/mL (ref 0.00–0.08)

## 2015-08-18 LAB — CBC
HEMATOCRIT: 42.1 % (ref 36.0–46.0)
Hemoglobin: 14.2 g/dL (ref 12.0–15.0)
MCH: 30.2 pg (ref 26.0–34.0)
MCHC: 33.7 g/dL (ref 30.0–36.0)
MCV: 89.6 fL (ref 78.0–100.0)
PLATELETS: 234 10*3/uL (ref 150–400)
RBC: 4.7 MIL/uL (ref 3.87–5.11)
RDW: 14 % (ref 11.5–15.5)
WBC: 7.1 10*3/uL (ref 4.0–10.5)

## 2015-08-18 LAB — GLUCOSE, POCT (MANUAL RESULT ENTRY): POC GLUCOSE: 72 mg/dL (ref 70–99)

## 2015-08-18 MED ORDER — CLONIDINE HCL 0.1 MG PO TABS
0.2000 mg | ORAL_TABLET | Freq: Once | ORAL | Status: AC
  Administered 2015-08-18: 0.2 mg via ORAL

## 2015-08-18 NOTE — ED Notes (Signed)
Pt left before being discharged by RN. Pt did not receive discharge paperwork.

## 2015-08-18 NOTE — ED Notes (Signed)
Pt upset about wait time, requesting to leave. Pt encouraged to wait for EDP to see her and evaluate test results. Pt and family member anxious to leave. EDP Zackowski aware of pt situation.

## 2015-08-18 NOTE — Discharge Instructions (Signed)
Workup for the chest pain without any significant findings. EKG without acute changes chest x-ray without any acute findings. Cardiac marker troponin negative 2. Patient's blood pressure also improved significantly here systolic now down below 130. Follow up of blood pressure will probably be required with her primary care doctor because it was high earlier today. Clonidine may have made a difference that she was given in the office. So patient may require adjustment of blood pressure medicines. Return for any new or worse symptoms.

## 2015-08-18 NOTE — Progress Notes (Signed)
Patient states she is here for her blood pressure Patient stated her blood pressure has been elevated Patient states she is feeling light headed some dizziness and complains Of having chest pain #7 on the pain scale Patient states her chest pain started a little while ago Patient stated she did take her blood pressure medication today

## 2015-08-18 NOTE — Progress Notes (Signed)
Patient ID: Kathleen Stout, female   DOB: [birth year], 44 y.o.   MRN: [id number]  CC: chest pain   HPI: Kathleen Stout is a 44 y.o. female here today for a follow up visit.  Patient has past medical history of HTN, Anemia, arthritis, and T2DM. Patient reports that she has been having chest pain since 11 am today. She reports that while at work she checked her blood pressure which was 201/108. She was sent home after that but did not go to the ER because she had a appointment with me today. She experienced dizziness, lightheaded, SOB, difficulty finding words, and slurred speech. She notes that the chest pain is pinching in nature and does not radiate to face or arms. She does currently have a headache, fatigued, blurred vision, and her body feels "tingly". She also reports nausea.    No Known Allergies Past Medical History  Diagnosis Date  . Hypertension   . Anemia   . Arthritis   . Diabetes mellitus without complication    Current Outpatient Prescriptions on File Prior to Visit  Medication Sig Dispense Refill  . lisinopril (PRINIVIL,ZESTRIL) 20 MG tablet Take 1 tablet (20 mg total) by mouth daily. 30 tablet 5  . metFORMIN (GLUCOPHAGE) 500 MG tablet Take 1 tablet (500 mg total) by mouth 2 (two) times daily with a meal. 60 tablet 4  . Blood Glucose Monitoring Suppl (TRUE METRIX METER) DEVI 1 kit by Does not apply route as directed. 1 Device 0  . glucose blood (TRUE METRIX BLOOD GLUCOSE TEST) test strip Use as instructed 100 each 12  . traMADol (ULTRAM) 50 MG tablet Take 1 tablet (50 mg total) by mouth 2 (two) times daily. As needed for pain 20 tablet 0  . TRUEPLUS LANCETS 28G MISC Use as directed 100 each 12   No current facility-administered medications on file prior to visit.   Family History  Problem Relation Age of Onset  . Diabetes Mother   . Hypertension Mother   . Hyperlipidemia Mother   . Heart disease Mother    Social History   Social History  . Marital Status: Single   Spouse Name: N/A  . Number of Children: N/A  . Years of Education: N/A   Occupational History  . Not on file.   Social History Main Topics  . Smoking status: Never Smoker   . Smokeless tobacco: Not on file  . Alcohol Use: No  . Drug Use: No  . Sexual Activity: Not on file   Other Topics Concern  . Not on file   Social History Narrative    Review of Systems: Other than what is stated in HPI, all other systems are negative.   Objective:   Filed Vitals:   08/18/15 1433  BP: 185/118  Pulse: 88  Temp:   Resp:     Physical Exam  Constitutional: She is oriented to person, place, and time.  Cardiovascular: Normal rate, regular rhythm and normal heart sounds.   Pulmonary/Chest: Effort normal and breath sounds normal. She has no wheezes.  Neurological: She is alert and oriented to person, place, and time. No cranial nerve deficit.  Skin: Skin is warm and dry.     Lab Results  Component Value Date   WBC 8.0 04/20/2015   HGB 14.6 04/20/2015   HCT 43.6 04/20/2015   MCV 87.7 04/20/2015   PLT 251 04/20/2015   Lab Results  Component Value Date   CREATININE 0.86 04/20/2015   BUN 12 04/20/2015  NA 137 04/20/2015   K 3.9 04/20/2015   CL 105 04/20/2015   CO2 26 04/20/2015    Lab Results  Component Value Date   HGBA1C 6.2% 07/30/2015   Lipid Panel     Component Value Date/Time   CHOL 179 04/20/2015 1505   TRIG 102 04/20/2015 1505   HDL 48 04/20/2015 1505   CHOLHDL 3.7 04/20/2015 1505   VLDL 20 04/20/2015 1505   LDLCALC 111* 04/20/2015 1505       Assessment and plan:   Diagnoses and all orders for this visit:  Chest pain, unspecified chest pain type -     EKG 12-Lead EKG: normal EKG, normal sinus rhythm.  Patient given Aspirin 325 mg before sent out to ER.  Patient has several risk factors including severe obesity, diabetes, HTN, and female. Patient will need troponins drawn  Elevated blood pressure -     cloNIDine (CATAPRES) tablet 0.2 mg; Take 2  tablets (0.2 mg total) by mouth once in office before sent to ER.  Dizziness Likely a result of elevated pressure but patient needs further evaluation  Type 2 diabetes mellitus without complication -     Glucose (CBG)--72  Patient will be transferred to ER via private vehicle. Patient will be driven by her boyfriend. Explained that she needs to go directly to ER for evaluation. Patient verbalizes understanding.       Lance Bosch, [facility] [phone] 08/18/2015, 2:54 PM

## 2015-08-18 NOTE — Progress Notes (Signed)
Patient is being sent to [facility] [place] for evaluation Spoke with charge nurse Shanda Bumps

## 2015-08-18 NOTE — ED Notes (Signed)
Pt reports being at work today and feeling dizzy. Pt states that the nurse took her BP and found it was 210 systolic. Pt reports mid chest pain with no radiation.

## 2015-08-18 NOTE — ED Provider Notes (Signed)
CSN: [id number]     Arrival date & time 08/18/15  1522 History   First MD Initiated Contact with Patient 08/18/15 2007     Chief Complaint  Patient presents with  . Chest Pain     (Consider location/radiation/quality/duration/timing/severity/associated sxs/prior Treatment) The history is provided by the patient and the spouse.   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 Patient was sent to her primary care doctor for further evaluation. Patient also developed chest pain substernal at 845 this resolved at 2:00 in the afternoon. So she was shortness of breath and some nausea. Patient also had dizziness and lightheadedness but no headache. Patient's lisinopril was doubled about a week ago by her primary care doctor. And her primary care doctor also gave her clonidine today in the office. Patient blood pressure started to improve while she was out in the waiting room. Chest pain is resolved. Chest pain was nonradiating chest pain was described as an ache. Patient was mostly concerned about her blood pressure.  Past Medical History  Diagnosis Date  . Hypertension   . Anemia   . Arthritis   . Diabetes mellitus without complication    Past Surgical History  Procedure Laterality Date  . Abdominal hysterectomy     Family History  Problem Relation Age of Onset  . Diabetes Mother   . Hypertension Mother   . Hyperlipidemia Mother   . Heart disease Mother    Social History  Substance Use Topics  . Smoking status: Never Smoker   . Smokeless tobacco: None  . Alcohol Use: No   OB History    No data available     Review of Systems  Constitutional: Negative for fever.  HENT: Negative for congestion.   Eyes: Negative for visual disturbance.  Respiratory: Positive for shortness of breath.   Cardiovascular: Positive for chest pain.  Gastrointestinal:  Positive for nausea. Negative for vomiting and abdominal pain.  Genitourinary: Negative for dysuria.  Musculoskeletal: Negative for back pain and neck pain.  Skin: Negative for rash.  Neurological: Positive for dizziness and light-headedness. Negative for seizures, weakness, numbness and headaches.  Hematological: Does not bruise/bleed easily.  Psychiatric/Behavioral: Negative for confusion.      Allergies  Review of patient's allergies indicates no known allergies.  Home Medications   Prior to Admission medications   Medication Sig Start Date End Date Taking? Authorizing Provider  lisinopril (PRINIVIL,ZESTRIL) 20 MG tablet Take 1 tablet (20 mg total) by mouth daily. 07/30/15  Yes Lance Bosch, NP  metFORMIN (GLUCOPHAGE) 500 MG tablet Take 1 tablet (500 mg total) by mouth 2 (two) times daily with a meal. 07/30/15  Yes Lance Bosch, NP  TRUEPLUS LANCETS 28G MISC Use as directed 07/30/15  Yes Lance Bosch, NP  Blood Glucose Monitoring Suppl (TRUE METRIX METER) DEVI 1 kit by Does not apply route as directed. 07/30/15   Lance Bosch, NP  glucose blood (TRUE METRIX BLOOD GLUCOSE TEST) test strip Use as instructed 07/30/15   Lance Bosch, NP  traMADol (ULTRAM) 50 MG tablet Take 1 tablet (50 mg total) by mouth 2 (two) times daily. As needed for pain Patient not taking: Reported on 08/18/2015 08/10/15   Carlos Levering Draper, DO   BP 121/80 mmHg  Pulse 61  Temp(Src) 98.1 F (36.7 C) (Oral)  Resp 24  Ht $R'5\' 4"'hd$  (1.626 m)  Wt 304 lb (137.893 kg)  BMI 52.16 kg/m2  SpO2 98% Physical Exam  Constitutional: She is oriented to person, place, and time. She appears well-developed and well-nourished. No distress.  HENT:  Head: Normocephalic and atraumatic.  Mouth/Throat: Oropharynx is clear and moist.  Eyes: Conjunctivae and EOM are normal. Pupils are equal, round, and reactive to light.  Neck: Normal range of motion. Neck supple.  Cardiovascular: Normal rate, regular rhythm and normal heart sounds.    No murmur heard. Pulmonary/Chest: Effort normal and breath sounds normal. No respiratory distress.  Abdominal: Soft. Bowel sounds are normal. There is no tenderness.  Musculoskeletal: Normal range of motion. She exhibits no edema.  Neurological: She is alert and oriented to person, place, and time. No cranial nerve deficit. She exhibits normal muscle tone. Coordination normal.  Skin: Skin is warm. No rash noted.  Nursing note and vitals reviewed.   ED Course  Procedures (including critical care time) Labs Review Labs Reviewed  BASIC METABOLIC PANEL - Abnormal; Notable for the following:    Potassium 3.4 (*)    Glucose, Bld 117 (*)    Calcium 8.8 (*)    All other components within normal limits  CBC  I-STAT TROPOININ, ED  I-STAT TROPOININ, ED   Results for orders placed or performed during the hospital encounter of 02/40/97  Basic metabolic panel  Result Value Ref Range   Sodium 137 135 - 145 mmol/L   Potassium 3.4 (L) 3.5 - 5.1 mmol/L   Chloride 103 101 - 111 mmol/L   CO2 26 22 - 32 mmol/L   Glucose, Bld 117 (H) 65 - 99 mg/dL   BUN 9 6 - 20 mg/dL   Creatinine, Ser 0.92 0.44 - 1.00 mg/dL   Calcium 8.8 (L) 8.9 - 10.3 mg/dL   GFR calc non Af Amer >60 >60 mL/min   GFR calc Af Amer >60 >60 mL/min   Anion gap 8 5 - 15  CBC  Result Value Ref Range   WBC 7.1 4.0 - 10.5 K/uL   RBC 4.70 3.87 - 5.11 MIL/uL   Hemoglobin 14.2 12.0 - 15.0 g/dL   HCT 42.1 36.0 - 46.0 %   MCV 89.6 78.0 - 100.0 fL   MCH 30.2 26.0 - 34.0 pg   MCHC 33.7 30.0 - 36.0 g/dL   RDW 14.0 11.5 - 15.5 %   Platelets 234 150 - 400 K/uL  I-stat troponin, ED  Result Value Ref Range   Troponin i, poc 0.00 0.00 - 0.08 ng/mL   Comment 3          I-Stat Troponin, ED (not at [facility])  Result Value Ref Range   Troponin i, poc 0.00 0.00 - 0.08 ng/mL   Comment 3             Imaging Review Dg Chest 2 View  08/18/2015   CLINICAL DATA:  Dizziness, high blood pressure, mid chest pain  EXAM: CHEST  2 VIEW  COMPARISON:   None.  FINDINGS: The heart size and mediastinal contours are within normal limits. Both lungs are clear but hypo aerated with crowding of the bronchovascular markings. Cholecystectomy clips are noted. The visualized skeletal structures are unremarkable.  IMPRESSION: Low volume exam without focal acute finding.   Electronically Signed   By: Conchita Paris M.D.   On: 08/18/2015 16:56   I have personally reviewed and evaluated these images and lab results as part of my medical decision-making.   EKG Interpretation   Date/Time:  Wednesday  August 18 2015 15:33:56 EDT Ventricular Rate:  81 PR Interval:  170 QRS Duration: 84 QT Interval:  374 QTC Calculation: 434 R Axis:   89 Text Interpretation:  Normal sinus rhythm Normal ECG Confirmed by  ZACKOWSKI  MD, SCOTT ([id number]) on 08/18/2015 9:14:12 PM      MDM   Final diagnoses:  Chest pain, unspecified chest pain type  Essential hypertension   The patient referred to the emergency department by primary care doctor for elevated blood pressure and for a brief period of chest pain. Patient blood pressure was reported to be 001 systolic associated with some dizziness and lightheadedness. Patient unfortunately had a long wait in the waiting room. Symptoms have pretty much improved and blood pressure is improved significantly. Patient is on lisinopril recently the dose was doubled, today in the office patient primary care doctor also added clonidine that has seemed to help control the blood pressure. The chest pain onset was at 845 it was substernal it was resolved by 2:00 in the afternoon. Mild shortness of breath and some nausea but no vomiting. No headache. Patient without any chest pain now.  Workup for the chest pain chest x-ray was negative for any acute findings troponins 2 were both negative. EKG without any acute findings at all. Patient's blood pressure improved to 128/80. Patient will be discharged home with precautions and also for follow-up  with her regular doctor for close monitoring of her blood pressure and further workup as needed.  Labs without any significant abnormalities. Patient's room air sats were 98% or higher.  Neuro exam without any evidence of focal deficits nothing to suggest a stroke. Dizziness has resolved.    Fredia Sorrow, MD 08/18/15 2350

## 2015-10-12 ENCOUNTER — Ambulatory Visit: Payer: Self-pay | Admitting: Sports Medicine

## 2015-10-18 ENCOUNTER — Ambulatory Visit: Payer: Self-pay | Admitting: Sports Medicine

## 2015-10-19 ENCOUNTER — Other Ambulatory Visit: Payer: Self-pay | Admitting: *Deleted

## 2015-10-19 DIAGNOSIS — M25562 Pain in left knee: Secondary | ICD-10-CM

## 2015-10-19 MED ORDER — TRAMADOL HCL 50 MG PO TABS: 50.0000 mg | ORAL_TABLET | Freq: Two times a day (BID) | ORAL | Status: DC

## 2015-10-19 NOTE — Telephone Encounter (Signed)
Patients Tramadol has been refilled with zero refills.

## 2015-10-29 ENCOUNTER — Ambulatory Visit: Payer: Self-pay | Admitting: Internal Medicine

## 2015-12-15 ENCOUNTER — Emergency Department (HOSPITAL_COMMUNITY)
Admission: EM | Admit: 2015-12-15 | Discharge: 2015-12-15 | Disposition: A | Discharge status: Home / Self Care | Payer: Self-pay | Attending: Emergency Medicine | Admitting: Emergency Medicine

## 2015-12-15 ENCOUNTER — Emergency Department (HOSPITAL_COMMUNITY): Payer: Self-pay

## 2015-12-15 ENCOUNTER — Encounter (HOSPITAL_COMMUNITY): Payer: Self-pay | Admitting: *Deleted

## 2015-12-15 DIAGNOSIS — Z8739 Personal history of other diseases of the musculoskeletal system and connective tissue: Secondary | ICD-10-CM | POA: Insufficient documentation

## 2015-12-15 DIAGNOSIS — Z7984 Long term (current) use of oral hypoglycemic drugs: Secondary | ICD-10-CM | POA: Insufficient documentation

## 2015-12-15 DIAGNOSIS — H9201 Otalgia, right ear: Secondary | ICD-10-CM | POA: Insufficient documentation

## 2015-12-15 DIAGNOSIS — Z79899 Other long term (current) drug therapy: Secondary | ICD-10-CM | POA: Insufficient documentation

## 2015-12-15 DIAGNOSIS — R61 Generalized hyperhidrosis: Secondary | ICD-10-CM | POA: Insufficient documentation

## 2015-12-15 DIAGNOSIS — J029 Acute pharyngitis, unspecified: Secondary | ICD-10-CM | POA: Insufficient documentation

## 2015-12-15 DIAGNOSIS — J4521 Mild intermittent asthma with (acute) exacerbation: Secondary | ICD-10-CM | POA: Insufficient documentation

## 2015-12-15 DIAGNOSIS — E669 Obesity, unspecified: Secondary | ICD-10-CM | POA: Insufficient documentation

## 2015-12-15 DIAGNOSIS — R51 Headache: Secondary | ICD-10-CM | POA: Insufficient documentation

## 2015-12-15 DIAGNOSIS — Z862 Personal history of diseases of the blood and blood-forming organs and certain disorders involving the immune mechanism: Secondary | ICD-10-CM | POA: Insufficient documentation

## 2015-12-15 DIAGNOSIS — E119 Type 2 diabetes mellitus without complications: Secondary | ICD-10-CM | POA: Insufficient documentation

## 2015-12-15 DIAGNOSIS — I10 Essential (primary) hypertension: Secondary | ICD-10-CM | POA: Insufficient documentation

## 2015-12-15 LAB — CBC
HCT: 42.4 % (ref 36.0–46.0)
Hemoglobin: 14.3 g/dL (ref 12.0–15.0)
MCH: 29.7 pg (ref 26.0–34.0)
MCHC: 33.7 g/dL (ref 30.0–36.0)
MCV: 88.1 fL (ref 78.0–100.0)
PLATELETS: 182 10*3/uL (ref 150–400)
RBC: 4.81 MIL/uL (ref 3.87–5.11)
RDW: 13.5 % (ref 11.5–15.5)
WBC: 4.5 10*3/uL (ref 4.0–10.5)

## 2015-12-15 LAB — BASIC METABOLIC PANEL
Anion gap: 11 (ref 5–15)
BUN: 10 mg/dL (ref 6–20)
CALCIUM: 8.9 mg/dL (ref 8.9–10.3)
CO2: 21 mmol/L — ABNORMAL LOW (ref 22–32)
CREATININE: 0.84 mg/dL (ref 0.44–1.00)
Chloride: 109 mmol/L (ref 101–111)
GFR calc Af Amer: >60 mL/min (ref >60)
Glucose, Bld: 136 mg/dL — ABNORMAL HIGH (ref 65–99)
POTASSIUM: 3.7 mmol/L (ref 3.5–5.1)
Sodium: 141 mmol/L (ref 135–145)

## 2015-12-15 MED ORDER — PREDNISONE 10 MG PO TABS: 20.0000 mg | ORAL_TABLET | Freq: Two times a day (BID) | ORAL | Status: DC

## 2015-12-15 MED ORDER — ALBUTEROL SULFATE (2.5 MG/3ML) 0.083% IN NEBU: 2.5000 mg | INHALATION_SOLUTION | Freq: Once | RESPIRATORY_TRACT | Status: DC

## 2015-12-15 MED ORDER — ALBUTEROL SULFATE HFA 108 (90 BASE) MCG/ACT IN AERS
2.0000 | INHALATION_SPRAY | RESPIRATORY_TRACT | Status: DC | PRN
  Administered 2015-12-15: 2 via RESPIRATORY_TRACT
  Filled 2015-12-15: qty 6.7

## 2015-12-15 MED ORDER — PREDNISONE 20 MG PO TABS
60.0000 mg | ORAL_TABLET | Freq: Once | ORAL | Status: AC
  Administered 2015-12-15: 60 mg via ORAL
  Filled 2015-12-15: qty 3

## 2015-12-15 MED ORDER — ALBUTEROL SULFATE (2.5 MG/3ML) 0.083% IN NEBU
INHALATION_SOLUTION | RESPIRATORY_TRACT | Status: DC
Start: 2015-12-15 — End: 2015-12-15
  Filled 2015-12-15: qty 6

## 2015-12-15 MED ORDER — ALBUTEROL SULFATE (2.5 MG/3ML) 0.083% IN NEBU
5.0000 mg | INHALATION_SOLUTION | Freq: Once | RESPIRATORY_TRACT | Status: AC
  Administered 2015-12-15: 5 mg via RESPIRATORY_TRACT

## 2015-12-15 MED ORDER — IPRATROPIUM-ALBUTEROL 0.5-2.5 (3) MG/3ML IN SOLN
3.0000 mL | Freq: Once | RESPIRATORY_TRACT | Status: AC
  Administered 2015-12-15: 3 mL via RESPIRATORY_TRACT
  Filled 2015-12-15: qty 3

## 2015-12-15 MED ORDER — ALBUTEROL (5 MG/ML) CONTINUOUS INHALATION SOLN
10.0000 mg/h | INHALATION_SOLUTION | RESPIRATORY_TRACT | Status: DC
  Administered 2015-12-15: 10 mg/h via RESPIRATORY_TRACT
  Filled 2015-12-15: qty 20

## 2015-12-15 NOTE — ED Notes (Signed)
Pt reports having recent cough and cold symptoms. Audible wheezing noted at triage, reports also having right ear pain and headache. spo2 99% at triage.

## 2015-12-15 NOTE — ED Provider Notes (Signed)
CSN: [id number]     Arrival date & time 12/15/15  1405 History  By signing my name below, I, Kathleen Stout, attest that this documentation has been prepared under the direction and in the presence of Debroah Baller, NP. Electronically Signed: Judithann Sauger, ED Scribe. 12/15/2015. 5:07 PM.    Chief Complaint  Patient presents with  . Cough  . Shortness of Breath  . Headache   Patient is a 45 y.o. female presenting with cough, shortness of breath, and headaches. The history is provided by the patient. No language interpreter was used.  Cough Cough characteristics:  Non-productive Severity:  Moderate Onset quality:  Gradual Duration:  1 day Timing:  Intermittent Progression:  Worsening Chronicity:  New Relieved by:  Nothing Worsened by:  Nothing tried Ineffective treatments:  Ipratropium inhaler Associated symptoms: chills, diaphoresis, ear pain, headaches, shortness of breath and sore throat (mild)   Associated symptoms: no fever   Shortness of Breath Associated symptoms: cough, diaphoresis, ear pain, headaches and sore throat (mild)   Associated symptoms: no fever   Headache Associated symptoms: cough, ear pain and sore throat (mild)   Associated symptoms: no fever    HPI Comments: Kathleen Stout is a 45 y.o. female with a hx of HTN and DM who presents to the Emergency Department complaining of gradually worsening persistent non-productive cough onset yesterday. Pt reports associated wheezing, generalized body aches, diaphoresis, chills, mild sore throat, and right ear pain. She denies taking any medications at home PTA. Pt had one Duoneb in the waiting room with no relief. No fever. She denies a hx of asthma.    Past Medical History  Diagnosis Date  . Hypertension   . Anemia   . Arthritis   . Diabetes mellitus without complication ([place])   . Obesity    Past Surgical History  Procedure Laterality Date  . Abdominal hysterectomy     Family History  Problem Relation Age  of Onset  . Diabetes Mother   . Hypertension Mother   . Hyperlipidemia Mother   . Heart disease Mother    Social History  Substance Use Topics  . Smoking status: Never Smoker   . Smokeless tobacco: None  . Alcohol Use: No   OB History    No data available     Review of Systems  Constitutional: Positive for chills and diaphoresis. Negative for fever.  HENT: Positive for ear pain and sore throat (mild).   Respiratory: Positive for cough and shortness of breath.   Neurological: Positive for headaches.  All other systems reviewed and are negative.     Allergies  Review of patient's allergies indicates no known allergies.  Home Medications   Prior to Admission medications   Medication Sig Start Date End Date Taking? Authorizing Provider  Blood Glucose Monitoring Suppl (TRUE METRIX METER) DEVI 1 kit by Does not apply route as directed. 07/30/15   Lance Bosch, NP  glucose blood (TRUE METRIX BLOOD GLUCOSE TEST) test strip Use as instructed 07/30/15   Lance Bosch, NP  lisinopril (PRINIVIL,ZESTRIL) 20 MG tablet Take 1 tablet (20 mg total) by mouth daily. 07/30/15   Lance Bosch, NP  metFORMIN (GLUCOPHAGE) 500 MG tablet Take 1 tablet (500 mg total) by mouth 2 (two) times daily with a meal. 07/30/15   Lance Bosch, NP  predniSONE (DELTASONE) 10 MG tablet Take 2 tablets (20 mg total) by mouth 2 (two) times daily with a meal. 12/15/15   [place], NP  traMADol (  ULTRAM) 50 MG tablet Take 1 tablet (50 mg total) by mouth 2 (two) times daily. As needed for pain 10/19/15   Lance Bosch, NP  TRUEPLUS LANCETS 28G MISC Use as directed 07/30/15   Lance Bosch, NP   BP 132/91 mmHg  Pulse 112  Temp(Src) 98.8 F (37.1 C) (Oral)  Resp 20  Ht _0  (1.549 m)  Wt 135.172 kg  BMI 56.34 kg/m2  SpO2 99% Physical Exam  Constitutional: She is oriented to person, place, and time. She appears well-developed and well-nourished.  HENT:  Head: Normocephalic and atraumatic.  TM normal Throat is  normal  Cardiovascular: Normal rate.   Pulmonary/Chest: Effort normal.  Inspiratory and Expiratory wheezing Decreased breath sounds blaterally  Abdominal: She exhibits no distension.  Neurological: She is alert and oriented to person, place, and time.  Skin: Skin is warm and dry.  Psychiatric: She has a normal mood and affect.  Nursing note and vitals reviewed.   ED Course  Procedures (including critical care time) DIAGNOSTIC STUDIES: Oxygen Saturation is 98% on RA, normal by my interpretation.    COORDINATION OF CARE:  5:03 PM- Pt advised of plan for treatment and pt agrees. Pt will receive another Duoneb, Prednisone, and albuterol.    Labs Review Labs Reviewed  BASIC METABOLIC PANEL - Abnormal; Notable for the following:    CO2 21 (*)    Glucose, Bld 136 (*)    All other components within normal limits  CBC    Imaging Review Dg Chest 2 View  12/15/2015  CLINICAL DATA:  Shortness of breath with wheezing and cough for 2 days EXAM: CHEST  2 VIEW COMPARISON:  August 18, 2015 FINDINGS: Lungs are clear. Heart size and pulmonary vascularity are within normal limits. No adenopathy. No bone lesions. IMPRESSION: No edema or consolidation. Electronically Signed   By: Lowella Grip III M.D.   On: 12/15/2015 15:15    Debroah Baller, NP has personally reviewed and evaluated these images and lab results as part of her medical decision-making.  After one hour continuous neb albuterol 10/atrovent 1 patient is feeling much better.  Re examined and air movement much improved, patient continues to have wheezing but decreased.  Patient eating and drinking in exam room without difficulty MDM: I discussed this case with Dr. Thurnell Garbe.   45 y.o. female with cough, wheezing and shortness of breath stable for discharge with significant improvement after one hour continuous neb treatment and prednisone. O2 SAT 9% on R/A. Will continue treatment with Rx for short steroid burst and will give Albuterol  inhaler with instructions prior to d/c. Discussed with the patient and all questioned fully answered. She will return if any problems arise.   Final diagnoses:  Reactive airway disease, mild intermittent, with acute exacerbation   I personally performed the services described in this documentation, which was scribed in my presence. The recorded information has been reviewed and is accurate.   [place], NP 12/15/15 [place], DO 12/18/15 2341

## 2015-12-15 NOTE — ED Notes (Signed)
Pt stable, ambulatory, states understanding of discharge instructions 

## 2015-12-15 NOTE — ED Notes (Signed)
Pt taken to xray 

## 2015-12-15 NOTE — Discharge Instructions (Signed)
Use the inhaler 2 puffs every 4 hours as needed for wheezing. Call you doctor in the morning for follow up.  Start the prescription for the prednisone tomorrow.  Return immediately for worsening symptoms such as shortness of breath, fever or other problems.

## 2015-12-15 NOTE — ED Notes (Signed)
Pt O2 99% ambulating on room air.

## 2015-12-16 ENCOUNTER — Encounter: Payer: Self-pay | Admitting: Internal Medicine

## 2015-12-24 ENCOUNTER — Ambulatory Visit: Payer: Self-pay | Admitting: Internal Medicine

## 2016-02-21 ENCOUNTER — Other Ambulatory Visit: Payer: Self-pay | Admitting: Internal Medicine

## 2016-02-21 DIAGNOSIS — M25562 Pain in left knee: Secondary | ICD-10-CM

## 2016-04-30 ENCOUNTER — Emergency Department (HOSPITAL_COMMUNITY)
Admission: EM | Admit: 2016-04-30 | Discharge: 2016-04-30 | Disposition: A | Discharge status: Home / Self Care | Payer: Worker's Compensation | Attending: Emergency Medicine | Admitting: Emergency Medicine

## 2016-04-30 ENCOUNTER — Encounter (HOSPITAL_COMMUNITY): Payer: Self-pay

## 2016-04-30 DIAGNOSIS — Z7984 Long term (current) use of oral hypoglycemic drugs: Secondary | ICD-10-CM | POA: Insufficient documentation

## 2016-04-30 DIAGNOSIS — H10211 Acute toxic conjunctivitis, right eye: Secondary | ICD-10-CM | POA: Insufficient documentation

## 2016-04-30 DIAGNOSIS — H5711 Ocular pain, right eye: Secondary | ICD-10-CM | POA: Diagnosis present

## 2016-04-30 DIAGNOSIS — T65894A Toxic effect of other specified substances, undetermined, initial encounter: Secondary | ICD-10-CM | POA: Insufficient documentation

## 2016-04-30 DIAGNOSIS — Z79899 Other long term (current) drug therapy: Secondary | ICD-10-CM | POA: Insufficient documentation

## 2016-04-30 DIAGNOSIS — I10 Essential (primary) hypertension: Secondary | ICD-10-CM | POA: Diagnosis not present

## 2016-04-30 DIAGNOSIS — E119 Type 2 diabetes mellitus without complications: Secondary | ICD-10-CM | POA: Insufficient documentation

## 2016-04-30 MED ORDER — TETRACAINE HCL 0.5 % OP SOLN
1.0000 [drp] | Freq: Once | OPHTHALMIC | Status: AC
  Administered 2016-04-30: 1 [drp] via OPHTHALMIC
  Filled 2016-04-30: qty 4

## 2016-04-30 MED ORDER — TOBRAMYCIN 0.3 % OP SOLN
1.0000 [drp] | OPHTHALMIC | Status: DC
  Administered 2016-04-30: 1 [drp] via OPHTHALMIC
  Filled 2016-04-30: qty 5

## 2016-04-30 NOTE — ED Provider Notes (Addendum)
CSN: [id number]     Arrival date & time 04/30/16  1816 History   First MD Initiated Contact with Patient 04/30/16 1933     Chief Complaint  Patient presents with  . Eye Injury     (Consider location/radiation/quality/duration/timing/severity/associated sxs/prior Treatment) HPI Patient supplies carpet cleaner into her right eye 11 AM today. She complains of burning sensation in her right eye and "seeing spots" she denies other complaint. Nothing makes symptoms better or worse. She treated herself with Visine prior to coming here Past Medical History  Diagnosis Date  . Hypertension   . Anemia   . Arthritis   . Diabetes mellitus without complication ([place])   . Obesity    Past Surgical History  Procedure Laterality Date  . Abdominal hysterectomy     Family History  Problem Relation Age of Onset  . Diabetes Mother   . Hypertension Mother   . Hyperlipidemia Mother   . Heart disease Mother    Social History  Substance Use Topics  . Smoking status: Never Smoker   . Smokeless tobacco: None  . Alcohol Use: No   OB History    No data available     Review of Systems  Constitutional: Negative.   Eyes: Positive for pain, redness and visual disturbance.  Allergic/Immunologic: Positive for immunocompromised state.       Diabetic      Allergies  Review of patient's allergies indicates no known allergies.  Home Medications   Prior to Admission medications   Medication Sig Start Date End Date Taking? Authorizing Provider  albuterol (PROVENTIL HFA;VENTOLIN HFA) 108 (90 Base) MCG/ACT inhaler Inhale 2 puffs into the lungs every 6 (six) hours as needed for wheezing or shortness of breath.   Yes Historical Provider, MD  Blood Glucose Monitoring Suppl (TRUE METRIX METER) DEVI 1 kit by Does not apply route as directed. 07/30/15   Lance Bosch, NP  glucose blood (TRUE METRIX BLOOD GLUCOSE TEST) test strip Use as instructed 07/30/15   Lance Bosch, NP  lisinopril (PRINIVIL,ZESTRIL) 20  MG tablet Take 1 tablet (20 mg total) by mouth daily. Patient not taking: Reported on 04/30/2016 07/30/15   Lance Bosch, NP  metFORMIN (GLUCOPHAGE) 500 MG tablet Take 1 tablet (500 mg total) by mouth 2 (two) times daily with a meal. Patient not taking: Reported on 04/30/2016 07/30/15   Lance Bosch, NP  predniSONE (DELTASONE) 10 MG tablet Take 2 tablets (20 mg total) by mouth 2 (two) times daily with a meal. Patient not taking: Reported on 04/30/2016 12/15/15   Ashley Murrain, NP  traMADol (ULTRAM) 50 MG tablet Take 1 tablet (50 mg total) by mouth 2 (two) times daily. As needed for pain Patient not taking: Reported on 04/30/2016 10/19/15   Lance Bosch, NP  TRUEPLUS LANCETS 28G MISC Use as directed 07/30/15   Lance Bosch, NP   BP 187/98 mmHg  Pulse 92  Temp(Src) 98.2 F (36.8 C) (Oral)  Resp 20  SpO2 97% Physical Exam  Constitutional: She is oriented to person, place, and time. She appears well-developed and well-nourished. No distress.  HENT:  Head: Normocephalic and atraumatic.  Right Ear: External ear normal.  Left Ear: External ear normal.  Mouth/Throat: Oropharynx is clear and moist.  Eyes: Pupils are equal, round, and reactive to light. Right eye exhibits discharge.  Right eye with some conjunctival erythema  with watery discharge left eye normal  pH of right eye prior to irrigation between 7 and 8  Neck: Neck supple.  Cardiovascular: Normal rate.   Pulmonary/Chest: Effort normal.  Abdominal:  Obese  Musculoskeletal: Normal range of motion.  Neurological: She is oriented to person, place, and time. No cranial nerve deficit.  Psychiatric: She has a normal mood and affect.    ED Course  Procedures (including critical care time) Labs Review Labs Reviewed - No data to display  Imaging Review No results found. I have personally reviewed and evaluated these images and lab results as part of my medical decision-making.   EKG Interpretation None     Patient feels improved after  treatment irrigation of right eye with Lilia Pro lens,  MDM  Right eye had normal pH upon arrival. Plan tobramycin ophthalmic drops 1 drop in right eye every 4 hours. Referral to Dr. Carolynn Sayers, ophthalmologist if her eye has continued discomfort in 1-2 days. Blood pressure recheck 3 weeks.Patient's blood pressure is elevated however she did not take her antihypertensive medication today. She is advised take medications as directed Diagnosis #1 chemical conjunctivitis #2 elevated blood pressure Final diagnoses:  None   #3 medication noncompliance     Orlie Dakin, MD 04/30/16 2116  Orlie Dakin, MD 04/30/16 2119

## 2016-04-30 NOTE — Discharge Instructions (Signed)
Chemical Conjunctivitis Place 1 drop of the antibiotic eyedrop in your right eye every 4 hours until bottle is gone. Call Dr.Groat scheduled office visit if your eye is still bothering in 2 days. Take your blood pressure medication as directed. Get your blood pressure recheck within the next 3 weeks at your doctor's office. Today's was elevated at 159/81 Chemical conjunctivitis is eye inflammation from exposure to an irritant or chemical substance. This causes the clear membrane that covers the white part of your eye and the inner surface of your eyelid (conjunctiva) to become inflamed. When the blood vessels in the conjunctiva become inflamed, the eye may become red, pink, and itchy. Chemical conjunctivitis can occur in one or both eyes. It cannot be spread by one person to another person (noncontagious). CAUSES This condition is caused by exposure to a chemical substance or irritant, such as:  Smoke.  Chlorine.  Soap.  Fumes.  Air pollution. RISK FACTORS This condition is more likely to develop in:  People who live somewhere with high levels of air pollution.  People who use swimming pools often. SYMPTOMS Symptoms of this condition may include:  Eye redness.  Tearing of the eyes.  Watery eyes.  Itchy eyes.  Burning feeling in the eyes.  Clear drainage from the eyes.  Swollen eyelids.  Sensitivity to light. DIAGNOSIS Your health care provider can diagnose this condition from your symptoms and medical history. The health care provider will also do a physical exam. If you have drainage from your eyes, it may be tested to rule out other causes of conjunctivitis. Your health care provider may also use a medical instrument that uses magnified light to examine the eyes (slit lamp).  TREATMENT Treatment for this condition involves carefully flushing the chemical out of your eye. You may also get antiallergy medicines or eye drops to use at home. HOME CARE INSTRUCTIONS  Take or  apply medicines only as directed by your health care provider.  Do not touch or rub your eyes.  Do not wear contact lenses until the inflammation is gone. Wear glasses instead.  Do not wear eye makeup until the inflammation is gone.  Apply a cool, clean washcloth to your eye for 10-20 minutes, 3-4 times a day.  Avoid exposure to the chemical or environment that caused the irritation. Wear eye protection as necessary. SEEK MEDICAL CARE IF:  Your symptoms get worse.  You have pus draining from your eye.  You have new symptoms.  You have a fever.  You have a change in vision.  You have increasing pain.   This information is not intended to replace advice given to you by your health care provider. Make sure you discuss any questions you have with your health care provider.   Document Released: 08/23/2005 Document Revised: 12/04/2014 Document Reviewed: 08/25/2014 Elsevier Interactive Patient Education [employer]2016 Elsevier Inc.

## 2016-04-30 NOTE — ED Notes (Signed)
Pt states carpet cleaner splashed into right eye.  Vision is blurred and eye is red.

## 2016-04-30 NOTE — ED Notes (Signed)
Morgan lens instilled after tetracaine drop placed in eye. Pt only able to tolerate of LR. Lens removed and MD notified. Eye then hand irrigated with patient holding eye open for @ of LR. Pt indicated she was unable to tolerate any further irritation. Pt states eye does feel better. Pt ask to call RN if she thought she was able to withstand any further irrigation.

## 2016-06-08 ENCOUNTER — Ambulatory Visit: Payer: BLUE CROSS/BLUE SHIELD | Attending: Internal Medicine | Admitting: Physician Assistant

## 2016-06-08 VITALS — BP 158/106 | HR 74 | Temp 98.8°F | Resp 18 | Wt 309.6 lb

## 2016-06-08 DIAGNOSIS — E119 Type 2 diabetes mellitus without complications: Secondary | ICD-10-CM | POA: Insufficient documentation

## 2016-06-08 DIAGNOSIS — R202 Paresthesia of skin: Secondary | ICD-10-CM

## 2016-06-08 DIAGNOSIS — R209 Unspecified disturbances of skin sensation: Secondary | ICD-10-CM | POA: Insufficient documentation

## 2016-06-08 DIAGNOSIS — Z79899 Other long term (current) drug therapy: Secondary | ICD-10-CM | POA: Diagnosis not present

## 2016-06-08 DIAGNOSIS — H9202 Otalgia, left ear: Secondary | ICD-10-CM

## 2016-06-08 DIAGNOSIS — I10 Essential (primary) hypertension: Secondary | ICD-10-CM | POA: Diagnosis not present

## 2016-06-08 DIAGNOSIS — Z7984 Long term (current) use of oral hypoglycemic drugs: Secondary | ICD-10-CM | POA: Insufficient documentation

## 2016-06-08 DIAGNOSIS — E669 Obesity, unspecified: Secondary | ICD-10-CM

## 2016-06-08 DIAGNOSIS — E1169 Type 2 diabetes mellitus with other specified complication: Secondary | ICD-10-CM

## 2016-06-08 LAB — COMPREHENSIVE METABOLIC PANEL
ALBUMIN: 3.9 g/dL (ref 3.6–5.1)
ALT: 13 U/L (ref 6–29)
AST: 15 U/L (ref 10–35)
Alkaline Phosphatase: 62 U/L (ref 33–115)
BUN: 11 mg/dL (ref 7–25)
CALCIUM: 9.1 mg/dL (ref 8.6–10.2)
CHLORIDE: 106 mmol/L (ref 98–110)
CO2: 25 mmol/L (ref 20–31)
Creat: 0.89 mg/dL (ref 0.50–1.10)
Glucose, Bld: 90 mg/dL (ref 65–99)
POTASSIUM: 4 mmol/L (ref 3.5–5.3)
Sodium: 140 mmol/L (ref 135–146)
TOTAL PROTEIN: 6.6 g/dL (ref 6.1–8.1)
Total Bilirubin: 0.4 mg/dL (ref 0.2–1.2)

## 2016-06-08 LAB — CBC WITH DIFFERENTIAL/PLATELET
BASOS ABS: 0 {cells}/uL (ref 0–200)
BASOS PCT: 0 %
EOS ABS: 78 {cells}/uL (ref 15–500)
Eosinophils Relative: 1 %
HEMATOCRIT: 42 % (ref 35.0–45.0)
Hemoglobin: 14.2 g/dL (ref 11.7–15.5)
LYMPHS PCT: 33 %
Lymphs Abs: 2574 cells/uL (ref 850–3900)
MCH: 29.2 pg (ref 27.0–33.0)
MCHC: 33.8 g/dL (ref 32.0–36.0)
MCV: 86.2 fL (ref 80.0–100.0)
MONO ABS: 624 {cells}/uL (ref 200–950)
MONOS PCT: 8 %
MPV: 10.3 fL (ref 7.5–12.5)
Neutro Abs: 4524 cells/uL (ref 1500–7800)
Neutrophils Relative %: 58 %
Platelets: 251 10*3/uL (ref 140–400)
RBC: 4.87 MIL/uL (ref 3.80–5.10)
RDW: 14.3 % (ref 11.0–15.0)
WBC: 7.8 10*3/uL (ref 3.8–10.8)

## 2016-06-08 LAB — GLUCOSE, POCT (MANUAL RESULT ENTRY): POC GLUCOSE: 89 mg/dL (ref 70–99)

## 2016-06-08 LAB — TSH: TSH: 1.62 mIU/L

## 2016-06-08 LAB — POCT GLYCOSYLATED HEMOGLOBIN (HGB A1C): Hemoglobin A1C: 6.4

## 2016-06-08 MED ORDER — CLONIDINE HCL 0.1 MG PO TABS
0.2000 mg | ORAL_TABLET | Freq: Once | ORAL | Status: AC
  Administered 2016-06-08: 0.2 mg via ORAL

## 2016-06-08 MED ORDER — METFORMIN HCL 500 MG PO TABS: 500.0000 mg | ORAL_TABLET | Freq: Two times a day (BID) | ORAL | Status: DC

## 2016-06-08 MED ORDER — LISINOPRIL 20 MG PO TABS: 20.0000 mg | ORAL_TABLET | Freq: Every day | ORAL | Status: DC

## 2016-06-08 MED FILL — LISINOPRIL 20 MG TABLET: 20 | 30 days supply | Qty: 30 | Fill #2

## 2016-06-08 MED FILL — ?METFORMIN HCL 500MG TABLET: 500 | 30 days supply | Qty: 60 | Fill #0

## 2016-06-08 NOTE — Progress Notes (Signed)
Kathleen Stout, is a 45 y.o. female  JTT:[id number]  ESP:[id number]  DOB - 1971-02-11  Chief Complaint  Patient presents with  . Otalgia        Subjective:  Chief Complaint and HPI: Kathleen Stout is a 45 y.o. female here today for L ear pain X 2 days.    No f/c.  No recent swimming or trauma to ears other than she admits to using Qtips.  No recent swimming.  No blood or drainage from ear.    She is out of metformin and lisinopril.  She has been out of both for several weeks.  She denies CP, HA, SOB, vision changes, or dizziness.  No polyuria/polydipsia  Also occasional sensation of her L leg "vibrating" when she lays down.  This happens sporadically and randomly with no regularity.  Denies pain or back injury.  Lasts no longer than 10 mins. She has had a hysterectomy so she no longer has periods.   ROS:   Constitutional:  No f/c, No night sweats, No unexplained weight loss. EENT:  No vision changes, No blurry vision, No hearing changes. No mouth, throat problems. +L ear pain Respiratory: No cough, No SOB Cardiac: No CP, no palpitations GI:  No abd pain, No N/V/D. GU: No Urinary s/sx Musculoskeletal: No joint pain Neuro: No headache, no dizziness, no motor weakness.  Skin: No rash Endocrine:  No polydipsia. No polyuria.  Psych: Denies SI/HI  No problems updated.  ALLERGIES: No Known Allergies  PAST MEDICAL HISTORY: Past Medical History  Diagnosis Date  . Hypertension   . Anemia   . Arthritis   . Diabetes mellitus without complication ([place])   . Obesity     MEDICATIONS AT HOME: Prior to Admission medications   Medication Sig Start Date End Date Taking? Authorizing Provider  albuterol (PROVENTIL HFA;VENTOLIN HFA) 108 (90 Base) MCG/ACT inhaler Inhale 2 puffs into the lungs every 6 (six) hours as needed for wheezing or shortness of breath.   Yes Historical Provider, MD  Blood Glucose Monitoring Suppl (TRUE METRIX METER) DEVI 1 kit by Does not apply route as  directed. 07/30/15  Yes Lance Bosch, NP  glucose blood (TRUE METRIX BLOOD GLUCOSE TEST) test strip Use as instructed 07/30/15  Yes Lance Bosch, NP  lisinopril (PRINIVIL,ZESTRIL) 20 MG tablet Take 1 tablet (20 mg total) by mouth daily. 06/08/16  Yes Argentina Donovan, PA-C  metFORMIN (GLUCOPHAGE) 500 MG tablet Take 1 tablet (500 mg total) by mouth 2 (two) times daily with a meal. 06/08/16  Yes Argentina Donovan, PA-C  TRUEPLUS LANCETS 28G MISC Use as directed 07/30/15  Yes Lance Bosch, NP     Objective:  EXAMDanley Danker Vitals:   06/08/16 1419  BP: 173/114  Pulse: 69  Temp: 98.8 F (37.1 C)  TempSrc: Oral  Resp: 18  Weight: 309 lb 9.6 oz (140.434 kg)  SpO2: 97%    General appearance : A&OX3. NAD. Non-toxic-appearing. Patient is obese.  HEENT: Atraumatic and Normocephalic.  PERRLA. EOM intact.  TM clear B. L canal at anterior aspect/wall just inside canal there is a distended vessel with a small scab.  No cellulitis of surrounding tissue or auricle.   Mouth-MMM, post pharynx WNL w/o erythema, No PND. Neck: supple, no JVD. No cervical lymphadenopathy. No thyromegaly Chest/Lungs:  Breathing-non-labored, Good air entry bilaterally, breath sounds normal without rales, rhonchi, or wheezing  CVS: S1 S2 regular, no murmurs, gallops, rubs  Extremities: Bilateral Lower Ext shows no  edema, both legs are warm to touch with = pulse throughout.  No abnormality of L leg Neurology:  CN II-XII grossly intact, Non focal.  DTR=B Psych:  TP linear. J/I WNL. Normal speech. Appropriate eye contact and affect.  Skin:  No Rash  Data Review Lab Results  Component Value Date   HGBA1C 6.2% 07/30/2015   HGBA1C 6.70 05/04/2015     Assessment & Plan   1. Essential hypertension-uncontrolled because she was out of meds - cloNIDine (CATAPRES) tablet 0.2 mg; Take 2 tablets (0.2 mg total) by mouth once.  BP came down from 173/114 to 158/106 30 mis after dose Resume- lisinopril (PRINIVIL,ZESTRIL) 20 MG tablet;  Take 1 tablet (20 mg total) by mouth daily.  Dispense: 30 tablet; Refill: 5 - Comprehensive metabolic panel - CBC with Differential/Platelet Check BP daily and record.  We have discussed target BP range and blood pressure goal. I have advised patient to check BP regularly and to call us back or report to clinic if the numbers are consistently higher than 140/90. We discussed the importance of compliance with medical therapy and DASH diet recommended, consequences of uncontrolled hypertension discussed.   2. Diabetes mellitus type 2 in obese (HCC) Resume- metFORMIN (GLUCOPHAGE) 500 MG tablet; Take 1 tablet (500 mg total) by mouth 2 (two) times daily with a meal.  Dispense: 60 tablet; Refill: 4 - Comprehensive metabolic panel - CBC with Differential/Platelet I have had a lengthy discussion and provided education about insulin resistance and the intake of too much sugar/refined carbohydrates.  I have advised the patient to work at a goal of eliminating sugary drinks, candy, desserts, sweets, refined sugars, processed foods, and white carbohydrates.  The patient expresses understanding.   3. Paresthesia L leg(occasional, sporadic, rare) - TSH  4. Otalgia of left ear Watchful waiting-avoid qtips  Patient have been counseled extensively about nutrition and exercise  F/up with me in 3 weeks and bring in BP log  The patient was given clear instructions to go to ER or return to medical center if symptoms don't improve, worsen or new problems develop. The patient verbalized understanding. The patient was told to call to get lab results if they haven't heard anything in the next week.     Freeman Caldron, PA-C [facility] and [place], [place]   06/08/2016, 2:49 PM

## 2016-06-09 ENCOUNTER — Telehealth: Payer: Self-pay

## 2016-06-09 NOTE — Telephone Encounter (Signed)
Contacted pt with lab results pt stated she has already been in contact about her results and is aware

## 2016-09-06 ENCOUNTER — Emergency Department (HOSPITAL_COMMUNITY): Payer: BLUE CROSS/BLUE SHIELD

## 2016-09-06 ENCOUNTER — Emergency Department (HOSPITAL_COMMUNITY)
Admission: EM | Admit: 2016-09-06 | Discharge: 2016-09-06 | Disposition: A | Discharge status: Home / Self Care | Payer: BLUE CROSS/BLUE SHIELD | Attending: Emergency Medicine | Admitting: Emergency Medicine

## 2016-09-06 ENCOUNTER — Encounter (HOSPITAL_COMMUNITY): Payer: Self-pay

## 2016-09-06 DIAGNOSIS — I10 Essential (primary) hypertension: Secondary | ICD-10-CM | POA: Diagnosis not present

## 2016-09-06 DIAGNOSIS — R079 Chest pain, unspecified: Secondary | ICD-10-CM | POA: Diagnosis present

## 2016-09-06 DIAGNOSIS — E119 Type 2 diabetes mellitus without complications: Secondary | ICD-10-CM | POA: Insufficient documentation

## 2016-09-06 LAB — CBC
HEMATOCRIT: 41.7 % (ref 36.0–46.0)
HEMOGLOBIN: 14 g/dL (ref 12.0–15.0)
MCH: 29.4 pg (ref 26.0–34.0)
MCHC: 33.6 g/dL (ref 30.0–36.0)
MCV: 87.4 fL (ref 78.0–100.0)
Platelets: 246 10*3/uL (ref 150–400)
RBC: 4.77 MIL/uL (ref 3.87–5.11)
RDW: 13.4 % (ref 11.5–15.5)
WBC: 7 10*3/uL (ref 4.0–10.5)

## 2016-09-06 LAB — BASIC METABOLIC PANEL
ANION GAP: 5 (ref 5–15)
BUN: 9 mg/dL (ref 6–20)
CHLORIDE: 107 mmol/L (ref 101–111)
CO2: 27 mmol/L (ref 22–32)
Calcium: 8.9 mg/dL (ref 8.9–10.3)
Creatinine, Ser: 0.9 mg/dL (ref 0.44–1.00)
Glucose, Bld: 116 mg/dL — ABNORMAL HIGH (ref 65–99)
POTASSIUM: 3.6 mmol/L (ref 3.5–5.1)
SODIUM: 139 mmol/L (ref 135–145)

## 2016-09-06 LAB — I-STAT TROPONIN, ED
Troponin i, poc: 0 ng/mL (ref 0.00–0.08)
Troponin i, poc: 0.01 ng/mL (ref 0.00–0.08)

## 2016-09-06 LAB — HEPATIC FUNCTION PANEL
ALT: 20 U/L (ref 14–54)
AST: 17 U/L (ref 15–41)
Albumin: 3.5 g/dL (ref 3.5–5.0)
Alkaline Phosphatase: 60 U/L (ref 38–126)
TOTAL PROTEIN: 6.6 g/dL (ref 6.5–8.1)
Total Bilirubin: 0.7 mg/dL (ref 0.3–1.2)

## 2016-09-06 NOTE — ED Provider Notes (Signed)
[facility] DEPT Provider Note   CSN: [id number] Arrival date & time: 09/06/16  1125     History   Chief Complaint Chief Complaint  Patient presents with  . Chest Pain    HPI Kathleen Stout is a 45 y.o. female.  The history is provided by the patient and medical records. No language interpreter was used.    Kathleen Stout is a 45 y.o. female  with a PMH of HTN, DM who presents to the Emergency Department complaining of Intermittent nonradiating central chest pain described as a burning sensation that began just prior to arrival today. She has had similar intermittent chest pain for the last week. 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 A coworker at work checked her blood pressure which she reports was 210/110. She does take Lisinopril for her BP and took this medication today, however she reports being non-compliant with medication in general, typically remembering to take it 3x/week. She was given nitroglycerin and 324 ASA prior to arrival which did improve her headache, but not her chest pain. She states she currently feels tremendously better, but not pain free. She denies back pain, shortness breath, abdominal pain, vomiting, fever.   Risk factors: + HTN, DM, mother had a heart attack in her mid 26s. No history of hyperlipidemia or heart disease. Not a smoker.  Past Medical History:  Diagnosis Date  . Anemia   . Arthritis   . Diabetes mellitus without complication ([place])   . Hypertension   . Obesity     Patient Active Problem List   Diagnosis Date Noted  . Morbid obesity with BMI of 50.0-59.9, adult ([place]) 08/02/2015  . Diabetes mellitus type 2 in obese ([place]) 08/02/2015  . Essential hypertension 05/21/2015  . Osteoarthritis of left knee 05/21/2015  . Knee pain, left 04/20/2015    Past Surgical History:  Procedure Laterality Date  . ABDOMINAL HYSTERECTOMY      OB History    No data  available       Home Medications    Prior to Admission medications   Medication Sig Start Date End Date Taking? Authorizing Provider  albuterol (PROVENTIL HFA;VENTOLIN HFA) 108 (90 Base) MCG/ACT inhaler Inhale 2 puffs into the lungs every 6 (six) hours as needed for wheezing or shortness of breath.   Yes Historical Provider, MD  Blood Glucose Monitoring Suppl (TRUE METRIX METER) DEVI 1 kit by Does not apply route as directed. 07/30/15  Yes Lance Bosch, NP  glucose blood (TRUE METRIX BLOOD GLUCOSE TEST) test strip Use as instructed 07/30/15  Yes Lance Bosch, NP  lisinopril (PRINIVIL,ZESTRIL) 20 MG tablet Take 1 tablet (20 mg total) by mouth daily. 06/08/16  Yes Argentina Donovan, PA-C  metFORMIN (GLUCOPHAGE) 500 MG tablet Take 1 tablet (500 mg total) by mouth 2 (two) times daily with a meal. 06/08/16  Yes Argentina Donovan, PA-C  TRUEPLUS LANCETS 28G MISC Use as directed 07/30/15  Yes Lance Bosch, NP    Family History Family History  Problem Relation Age of Onset  . Diabetes Mother   . Hypertension Mother   . Hyperlipidemia Mother   . Heart disease Mother     Social History Social History  Substance Use Topics  . Smoking status: Never Smoker  . Smokeless tobacco: Never Used  . Alcohol use No     Allergies   Review of patient's allergies indicates no known allergies.  Review of Systems Review of Systems  Constitutional: Negative for fever.  HENT: Negative for congestion.   Eyes: Negative for visual disturbance.  Respiratory: Negative for cough and shortness of breath.   Cardiovascular: Positive for chest pain. Negative for palpitations and leg swelling.  Gastrointestinal: Positive for nausea. Negative for abdominal pain and vomiting.  Genitourinary: Negative for dysuria.  Musculoskeletal: Negative for back pain and neck pain.  Skin: Negative for rash.  Neurological: Positive for headaches.     Physical Exam Updated Vital Signs BP 135/88   Pulse 76   Temp 98 F  (36.7 C) (Oral)   Resp 23   Ht '5\' 4"'$  (1.626 m)   Wt (!) 142.4 kg   SpO2 93%   BMI 53.90 kg/m   Physical Exam  Constitutional: She is oriented to person, place, and time. She appears well-developed and well-nourished. No distress.  HENT:  Head: Normocephalic and atraumatic.  Cardiovascular: Normal rate, regular rhythm, normal heart sounds and intact distal pulses.  Exam reveals no gallop and no friction rub.   No murmur heard. Pulmonary/Chest: Effort normal and breath sounds normal. No respiratory distress. She has no wheezes. She has no rales. She exhibits tenderness.    Abdominal: Soft. Bowel sounds are normal. She exhibits no distension. There is tenderness (Epigastric).  Musculoskeletal: She exhibits no edema.  Neurological: She is alert and oriented to person, place, and time.  Skin: Skin is warm and dry.  Nursing note and vitals reviewed.    ED Treatments / Results  Labs (all labs ordered are listed, but only abnormal results are displayed) Labs Reviewed  BASIC METABOLIC PANEL - Abnormal; Notable for the following:       Result Value   Glucose, Bld 116 (*)    All other components within normal limits  HEPATIC FUNCTION PANEL - Abnormal; Notable for the following:    Bilirubin, Direct <0.1 (*)    All other components within normal limits  CBC  LIPASE, BLOOD  I-STAT TROPOININ, ED  I-STAT TROPOININ, ED    EKG  EKG Interpretation  Date/Time:  Wednesday September 06 2016 11:27:58 EDT Ventricular Rate:  76 PR Interval:    QRS Duration: 104 QT Interval:  398 QTC Calculation: 448 R Axis:   79 Text Interpretation:  Sinus rhythm Probable anterolateral infarct, old Confirmed by Alvino Chapel  MD, NATHAN [phone]) on 09/06/2016 3:26:28 PM       Radiology Dg Chest 2 View  Result Date: 09/06/2016 CLINICAL DATA:  45 year old female with central and left chest pain for 1 week. Hypertensive. Dizziness. Initial encounter. EXAM: CHEST  2 VIEW COMPARISON:  12/15/2015 and  earlier. FINDINGS: Similar somewhat low lung volumes. Stable cardiac size at the upper limits of normal to mildly enlarged. Other mediastinal contours are within normal limits. Visualized tracheal air column is within normal limits. No pneumothorax, pulmonary edema, pleural effusion or confluent pulmonary opacity. No acute osseous abnormality identified. Stable cholecystectomy clips. IMPRESSION: 1.  No acute cardiopulmonary abnormality. 2. Mildly low lung volumes.  Borderline to mild cardiomegaly. Electronically Signed   By: Genevie Ann M.D.   On: 09/06/2016 13:10    Procedures Procedures (including critical care time)  Medications Ordered in ED Medications - No data to display   Initial Impression / Assessment and Plan / ED Course  I have reviewed the triage vital signs and the nursing notes.  Pertinent labs & imaging results that were available during my care of the patient were reviewed by me and considered in my  medical decision making (see chart for details).  Clinical Course   Evonne Rinks is a 45 y.o. female who presents to ED for chest pain. Heart score of 3. PERC negative. ASA and nitro given prior to arrival and patient reports that nitro helped her headache but not her chest discomfort. She is tender to palpation of epigastrium as well as left-side of the chest. Labs reviewed and reassuring. Chest x-ray and EKG reviewed and reassuring. Troponins negative 2. Cardiology was consult at in the ER to schedule an outpatient appointment. She has an appointment for follow-up on October 24 at 72 AM with the cardiology nurse practitioner. She is aware of this appointment and agrees to follow up as recommended. Patient feels safe for discharge to home. Reasons to return to ER were discussed and all questions answered.  Patient discussed with Dr. Alvino Chapel who agrees with treatment plan.    Final Clinical Impressions(s) / ED Diagnoses   Final diagnoses:  Chest pain, unspecified type    New  Prescriptions New Prescriptions   No medications on file     [facility] Zitlaly Malson, PA-C 09/06/16 [place], MD 09/06/16 [phone]

## 2016-09-06 NOTE — ED Triage Notes (Signed)
Chest pain started a week ago. Was at work today and the pain got worse. Pt. Received nitro and 324 ASA in the truck that decreased the pain. CO of dizziness and nausea with with chest pain. BP was reported to be high before the nitro with vision changes, dizziness, and a headache.

## 2016-09-06 NOTE — Discharge Instructions (Signed)
You have an appointment with Cardiology, Basim Bartnik Givenshris Berge NP on Tuesday, 10/24 at 11:00 am. If for some reason you cannot make this appointment, it is very important that you call to reschedule.  Return to ER for new or worsening symptoms, any additional concerns.

## 2016-09-19 ENCOUNTER — Encounter: Payer: Self-pay | Admitting: Nurse Practitioner

## 2016-09-19 ENCOUNTER — Ambulatory Visit (INDEPENDENT_AMBULATORY_CARE_PROVIDER_SITE_OTHER): Payer: BLUE CROSS/BLUE SHIELD | Admitting: Nurse Practitioner

## 2016-09-19 VITALS — BP 167/85 | HR 62 | Ht 64.0 in | Wt 319.6 lb

## 2016-09-19 DIAGNOSIS — R0609 Other forms of dyspnea: Secondary | ICD-10-CM | POA: Diagnosis not present

## 2016-09-19 DIAGNOSIS — R079 Chest pain, unspecified: Secondary | ICD-10-CM | POA: Insufficient documentation

## 2016-09-19 DIAGNOSIS — I10 Essential (primary) hypertension: Secondary | ICD-10-CM

## 2016-09-19 DIAGNOSIS — E119 Type 2 diabetes mellitus without complications: Secondary | ICD-10-CM | POA: Insufficient documentation

## 2016-09-19 MED ORDER — LISINOPRIL 40 MG PO TABS: 40.0000 mg | ORAL_TABLET | Freq: Every day | ORAL | 5 refills | Status: DC

## 2016-09-19 NOTE — Progress Notes (Signed)
Cardiology Clinic Note   Patient Name: Kathleen Stout Date of Encounter: 09/19/2016  Primary Care Provider:  Lance Bosch, NP Primary Cardiologist:  NEW - to f/u with Adora Fridge, MD   Patient Profile    45 year old female with a history of diabetes, hypertension, and obesity, who presents to establish cardiovascular care in the setting of recent episodes of chest pain and dyspnea on exertion.  Past Medical History    Past Medical History:  Diagnosis Date  . Anemia   . Arthritis   . Chest pressure   . Essential hypertension   . Morbid obesity ([place])   . Type II diabetes mellitus ([place])    Past Surgical History:  Procedure Laterality Date  . ABDOMINAL HYSTERECTOMY      Allergies  No Known Allergies  History of Present Illness    45 year old female with a prior history of hypertension dating back probably 5 years. She is also a type II diabetic and was placed on metformin about one year ago. She is morbidly obese and weighs 319 pounds. She is originally from [place], [place] and moved to [place] about 2 years ago to be closer to her spouse. 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 In that setting, she says that she is recently active at work though notes that she does not exercise outside of work. She admits to not being very good about taking her medications as routinely scheduled and has been seen by primary care in the past after running out of her medications completely.   In early October, she began to experience constant, 9/10 left-sided chest discomfort associated with dyspnea on exertion which she says was somewhat worse with position changes. Despite severe pain, she continues to work and carry out her usual activities. On October 11, she was at work and reported her chest pain to a Mudlogger. The coworker checked her blood pressure and her pressure was markedly elevated. She continued to work but then her supervisor repeated her  blood pressure and then recommended that she go to the emergency department. EMS was called and patient was given aspirin and nitroglycerin. Pressure was reportedly 998 systolic. In the ER, blood pressure improved and was 135/88. It turned out she had only been taking her lisinopril 3 times a week. ECG was normal. Troponins were also normal. She was advised to follow-up with cardiology given risk factors, symptoms, and family history.   Since her ER visit, she has not been having constant chest discomfort but still has noted exertional chest discomfort and dyspnea. She has continued to work. She denies any history of palpitations, PND, orthopnea, dizziness, syncope, edema, or early satiety. She has been compliant with her lisinopril. She does not check her blood pressure at home and it is 167/85 today.  Home Medications    Prior to Admission medications   Medication Sig Start Date End Date Taking? Authorizing Provider  albuterol (PROVENTIL HFA;VENTOLIN HFA) 108 (90 Base) MCG/ACT inhaler Inhale 2 puffs into the lungs every 6 (six) hours as needed for wheezing or shortness of breath.   Yes Historical Provider, MD  Blood Glucose Monitoring Suppl (TRUE METRIX METER) DEVI 1 kit by Does not apply route as directed. 07/30/15  Yes Lance Bosch, NP  glucose blood (TRUE METRIX BLOOD GLUCOSE TEST) test strip Use as instructed 07/30/15  Yes Lance Bosch, NP  lisinopril (PRINIVIL,ZESTRIL) 40 MG tablet Take 1 tablet (40 mg total) by mouth daily. 09/19/16  Yes Rogelia Mire, NP  metFORMIN (GLUCOPHAGE) 500 MG tablet Take 1 tablet (500 mg total) by mouth 2 (two) times daily with a meal. 06/08/16  Yes Argentina Donovan, PA-C  TRUEPLUS LANCETS 28G MISC Use as directed 07/30/15  Yes Lance Bosch, NP    Family History    Family History  Problem Relation Age of Onset  . Diabetes Mother   . Hypertension Mother   . Hyperlipidemia Mother   . Heart disease Mother   . Healthy Sister   . Healthy Brother   .  Healthy Sister   . Healthy Sister   . Healthy Sister   . Diabetes Brother   . Hypertension Brother   . Healthy Brother   . Healthy Brother   . Healthy Brother   . Healthy Brother   . Healthy Brother   . Healthy Brother   . Healthy Brother   . Healthy Brother     Social History    Social History   Social History  . Marital status: Single    Spouse name: N/A  . Number of children: N/A  . Years of education: N/A   Occupational History  . Housekeeper    Social History Main Topics  . Smoking status: Never Smoker  . Smokeless tobacco: Never Used  . Alcohol use No  . Drug use: No  . Sexual activity: Not on file   Other Topics Concern  . Not on file   Social History Narrative   Does not routinely exercise.  Regularly eats fast food, especially [employer] and [place] food.  Originally from [place], [place] - moved to [place] in 2015.     Review of Systems    General:  No chills, fever, night sweats or weight changes.  Cardiovascular:  +++ chest pain as outlined above, +++ dyspnea on exertion, no edema, orthopnea, palpitations, paroxysmal nocturnal dyspnea. Dermatological: No rash, lesions/masses Respiratory: No cough, +++ dyspnea Urologic: No hematuria, dysuria Abdominal:   No nausea, vomiting, diarrhea, bright red blood per rectum, melena, or hematemesis Neurologic:  No visual changes, wkns, changes in mental status. All other systems reviewed and are otherwise negative except as noted above.  Physical Exam    VS:  BP (!) 167/85 (BP Location: Right Arm)   Pulse 62   Ht '5\' 4"'$  (1.626 m)   Wt (!) 319 lb 9.6 oz (145 kg)   BMI 54.86 kg/m  , BMI Body mass index is 54.86 kg/m. GEN: Morbidly obese ? in no acute distress.  HEENT: normal.  Neck: Supple, obese, difficult to gauge JVP, no carotid bruits, or masses. Cardiac: RRR, no murmurs, rubs, or gallops. No clubbing, cyanosis, edema.  Radials/DP/PT 2+ and equal bilaterally.  Respiratory:  Respirations regular and unlabored,  clear to auscultation bilaterally. GI: Soft, nontender, nondistended, BS + x 4. MS: no deformity or atrophy. Skin: warm and dry, no rash. Neuro:  Strength and sensation are intact. Psych: Normal affect.  Accessory Clinical Findings    ECG - Regular sinus rhythm, 62, no acute ST or T changes.  Assessment & Plan   1.  Chest pain: Patient with prior history of hypertension, obesity, diabetes, and family history of CAD developed constant chest pain in early October which persisted for approximately one week prior to being seen in the emergency department and found to be markedly hypertensive. Evaluation in the ED was notable for normal troponins and also normal ECG. Since her ER visit, she has been more compliant with her blood  pressure medication and has noted less frequent chest discomfort, though she continues to have exertional chest pain and dyspnea. In that setting, I will arrange for a Lexiscan Myoview to rule out ischemia. Because of her weight, this will be a 2 day study. I will also arrange for echocardiogram given dyspnea on exertion.  I have discussed her case with Dr. Gwenlyn Found today.  2. Dyspnea exertion: In the setting of above. Follow-up echocardiogram to evaluate LV function and rule out structural abnormalities.  3. Essential hypertension: This is been poorly controlled in the past in the setting of medication and dietary noncompliance. We talked at length about the need to avoid sodium in her diet. She frequently eats Janine [employer]bo and also [place] food. Her blood pressure is elevated today and I will increase her lisinopril to 40 mg daily with a plan to follow up with basic metabolic panel next week. When I see her back, I will readdress blood pressure and consider adding chlorthalidone if she remains hypertensive.  4. Morbid obesity: We did discuss the importance of caloric restriction and increase activity with a goal of 1 pound per week weight loss. Patient does not currently exercise  though says she is fairly active at work. She eats out regularly and we talked about cutting back on this and trying to prepare more meals at home.  5. Type 2 diabetes mellitus: She is on metformin and says she is now taking it as prescribed. Her A1c was 6.4 in July.  6. Disposition: Follow-up stress testing and echocardiogram. I will plan to see her back in one month or sooner if necessary.  Murray Hodgkins, NP 09/19/2016, 12:08 PM

## 2016-09-19 NOTE — Patient Instructions (Signed)
Medication Instructions:  INCREASE LISINOPRIL TO 40MG   Labwork: BMP SOMETIME NEXT WEEK  Testing/Procedures: Your physician has requested that you have a lexiscan myoview. For further information please visit [email or web address]. Please follow instruction sheet, as given.  Your physician has requested that you have an echocardiogram. Echocardiography is a painless test that uses sound waves to create images of your heart. It provides your doctor with information about the size and shape of your heart and how well your heart's chambers and valves are working. This procedure takes approximately one hour. There are no restrictions for this procedure.  Follow-Up: Your physician recommends that you schedule a follow-up appointment in: 1 MONTH WITH CHRIS BERGE   Any Other Special Instructions Will Be Listed Below (If Applicable).     If you need a refill on your cardiac medications before your next appointment, please call your pharmacy.

## 2016-09-22 ENCOUNTER — Telehealth (HOSPITAL_COMMUNITY): Payer: Self-pay

## 2016-09-22 NOTE — Telephone Encounter (Signed)
Encounter complete. 

## 2016-09-27 ENCOUNTER — Ambulatory Visit (HOSPITAL_COMMUNITY)
Admission: RE | Admit: 2016-09-27 | Discharge: 2016-09-27 | Disposition: A | Discharge status: Home / Self Care | Payer: BLUE CROSS/BLUE SHIELD | Source: Ambulatory Visit | Attending: Cardiovascular Disease | Admitting: Cardiovascular Disease

## 2016-09-27 DIAGNOSIS — R079 Chest pain, unspecified: Secondary | ICD-10-CM | POA: Diagnosis not present

## 2016-09-27 MED ORDER — REGADENOSON 0.4 MG/5ML IV SOLN
0.4000 mg | Freq: Once | INTRAVENOUS | Status: AC
  Administered 2016-09-27: 0.4 mg via INTRAVENOUS

## 2016-09-27 MED ORDER — TECHNETIUM TC 99M TETROFOSMIN IV KIT
30.1000 | PACK | Freq: Once | INTRAVENOUS | Status: AC | PRN
  Administered 2016-09-27: 30.1 via INTRAVENOUS
  Filled 2016-09-27: qty 31

## 2016-09-28 ENCOUNTER — Ambulatory Visit (HOSPITAL_COMMUNITY)
Admission: RE | Admit: 2016-09-28 | Discharge: 2016-09-28 | Disposition: A | Discharge status: Home / Self Care | Payer: BLUE CROSS/BLUE SHIELD | Source: Ambulatory Visit | Attending: Cardiology | Admitting: Cardiology

## 2016-09-28 LAB — MYOCARDIAL PERFUSION IMAGING
CHL CUP NUCLEAR SDS: 6
CSEPPHR: 100 {beats}/min
LV sys vol: 35 mL
LVDIAVOL: 108 mL (ref 46–106)
NUC STRESS TID: 0.84
Rest HR: 78 {beats}/min
SRS: 0
SSS: 6

## 2016-09-28 LAB — BASIC METABOLIC PANEL
BUN: 10 mg/dL (ref 7–25)
CO2: 27 mmol/L (ref 20–31)
Calcium: 9.3 mg/dL (ref 8.6–10.2)
Chloride: 104 mmol/L (ref 98–110)
Creat: 0.82 mg/dL (ref 0.50–1.10)
GLUCOSE: 103 mg/dL — AB (ref 65–99)
POTASSIUM: 3.9 mmol/L (ref 3.5–5.3)
SODIUM: 140 mmol/L (ref 135–146)

## 2016-09-28 MED ORDER — TECHNETIUM TC 99M TETROFOSMIN IV KIT: 29.6000 | PACK | Freq: Once | INTRAVENOUS | Status: AC | PRN

## 2016-10-03 ENCOUNTER — Telehealth: Payer: Self-pay | Admitting: Nurse Practitioner

## 2016-10-03 NOTE — Telephone Encounter (Signed)
Follow Up: ° ° ° °Pt returning your call from yesterday. °

## 2016-10-10 ENCOUNTER — Other Ambulatory Visit (HOSPITAL_COMMUNITY): Payer: Self-pay

## 2016-10-18 ENCOUNTER — Ambulatory Visit (INDEPENDENT_AMBULATORY_CARE_PROVIDER_SITE_OTHER): Payer: BLUE CROSS/BLUE SHIELD | Admitting: Nurse Practitioner

## 2016-10-18 ENCOUNTER — Encounter: Payer: Self-pay | Admitting: Nurse Practitioner

## 2016-10-18 VITALS — BP 174/103 | HR 86 | Ht 64.0 in | Wt 318.6 lb

## 2016-10-18 DIAGNOSIS — R0609 Other forms of dyspnea: Secondary | ICD-10-CM | POA: Diagnosis not present

## 2016-10-18 DIAGNOSIS — I10 Essential (primary) hypertension: Secondary | ICD-10-CM | POA: Diagnosis not present

## 2016-10-18 DIAGNOSIS — R0789 Other chest pain: Secondary | ICD-10-CM | POA: Diagnosis not present

## 2016-10-18 MED ORDER — CHLORTHALIDONE 25 MG PO TABS: 25.0000 mg | ORAL_TABLET | Freq: Every day | ORAL | 3 refills | Status: DC

## 2016-10-18 NOTE — Progress Notes (Signed)
Office Visit    Patient Name: Kathleen Stout Date of Encounter: 10/18/2016  Primary Care Provider:  Lance Bosch, NP Primary Cardiologist:  Adora Fridge, MD   Chief Complaint    45 year old female with a history of diabetes, hypertension, obesity, and c/p s/p recent neg MV, who presents for f/u.  Past Medical History    Past Medical History:  Diagnosis Date  . Anemia   . Arthritis   . Chest pressure    a. 08/2016 Myoview: EF 68%, no ST changes, basal anterior/mid anterior defect - felt to be attenuation-->Low risk.  . Essential hypertension   . Morbid obesity ([place])   . Type II diabetes mellitus ([place])    Past Surgical History:  Procedure Laterality Date  . ABDOMINAL HYSTERECTOMY      Allergies  No Known Allergies  History of Present Illness    45 year old female with a prior history of hypertension dating back probably 5 years. She is also a type II diabetic and was placed on metformin about one year ago. She is morbidly obese and weighs 319 pounds. She is originally from [place], [place] and moved to [place] about 2 years ago to be closer to her spouse. 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  She was seen in the ER in early October secondary to constant chest pain and elevated BPs.  BP improved in the ED and she was advised to take lisinopril as Rx (was only taking 3x/wk).  Troponins were nl.    I saw her in clinic on 10/24, @ which time she continued to report constant c/p that was worse with exertion.  She underwent stress testing, which was low risk with attenuation artifact.  Since her last visit, she has not had any recurrence of chest pain.  She does note some DOE with prolonged walking, though this is stable and she attributes this to being out of shape.  She says that she is trying to lose weight but continues to eat out regularly (mostly [employer]).  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  She does not check  her BP @ home/work but does report compliance with her lisinopril.  She denies palpitations, pnd, orthopnea, n, v, dizziness, syncope, edema, weight gain, or early satiety.   Home Medications    Prior to Admission medications   Medication Sig Start Date End Date Taking? Authorizing Provider  albuterol (PROVENTIL HFA;VENTOLIN HFA) 108 (90 Base) MCG/ACT inhaler Inhale 2 puffs into the lungs every 6 (six) hours as needed for wheezing or shortness of breath.   Yes Historical Provider, MD  Blood Glucose Monitoring Suppl (TRUE METRIX METER) DEVI 1 kit by Does not apply route as directed. 07/30/15  Yes Lance Bosch, NP  glucose blood (TRUE METRIX BLOOD GLUCOSE TEST) test strip Use as instructed 07/30/15  Yes Lance Bosch, NP  lisinopril (PRINIVIL,ZESTRIL) 40 MG tablet Take 1 tablet (40 mg total) by mouth daily. 09/19/16  Yes Rogelia Mire, NP  metFORMIN (GLUCOPHAGE) 500 MG tablet Take 1 tablet (500 mg total) by mouth 2 (two) times daily with a meal. 06/08/16  Yes Argentina Donovan, PA-C  TRUEPLUS LANCETS 28G MISC Use as directed 07/30/15  Yes Lance Bosch, NP    Review of Systems    No further c/p.  Chronic/stable DOE.  She denies palpitations, pnd, orthopnea, n, v, dizziness, syncope, edema, weight gain, or early satiety.  All other systems  reviewed and are otherwise negative except as noted above.  Physical Exam    VS:  Ht '5\' 4"'$  (1.626 m)   Wt (!) 318 lb 9.6 oz (144.5 kg)   SpO2 95%   BMI 54.69 kg/m  , BMI Body mass index is 54.69 kg/m. GEN: obese ? in no acute distress.  HEENT: normal.  Neck: Supple, obese, difficult to gauge JVP, no carotid bruits, or masses. Cardiac: RRR, no murmurs, rubs, or gallops. No clubbing, cyanosis, edema.  Radials/DP/PT 2+ and equal bilaterally.  Respiratory:  Respirations regular and unlabored, clear to auscultation bilaterally. GI: Soft, nontender, nondistended, BS + x 4. MS: no deformity or atrophy. Skin: warm and dry, no rash. Neuro:  Strength and  sensation are intact. Psych: Normal affect.  Accessory Clinical Findings    Lab Results  Component Value Date   CREATININE 0.82 09/27/2016   BUN 10 09/27/2016   NA 140 09/27/2016   K 3.9 09/27/2016   CL 104 09/27/2016   CO2 27 09/27/2016   Assessment & Plan    1.  Chest Pressure:  Resolved.  Low risk MV with nl EF.  2.  DOE:  Nl EF by nuc study.  Suspect this is 2/2 deconditioning/morbid obesity.  I've encouraged her to increase her activity levels with a goal of wt loss.  3.  Essential HTN:  She has been compliant with lisinopril 40 daily but bp remains elevated.  She continues to eat out for most meals and specifically likes to go to [employer] and [employer].  We again discussed the importance of avoid salty foods and she says that she "will try."  I will add chlorthalidone 25 mg daily today and arrange for f/u bmet and pharmacist BP check in 1 wk.  If pressure remains markedly elevated and renal fxn stable, I would plan to titrate chlorthalidone, however I suspect she may require an additional agent (amlodipine next) beyond that if she doesn't begin to seriously change her sodium intake and address her obesity.  4.  Morbid Obesity:  She says that she is trying to lose wt but has not changed her caloric intake or increased her activity.  We again discussed reasonable wt loss goals, calorie restriction, and potential benefits of calorie counting via a phone application in order to increase awareness and track progress.  5.  Type 2 DM:  Remains on metformin.  A1c 6.4 in July.  6.  Disposition:  F/u bmet and bp check in 1 wk.  I can see back in 2-3 mos, though HTN can also be managed by primary care going forward.  Murray Hodgkins, NP 10/18/2016, 3:35 PM

## 2016-10-18 NOTE — Patient Instructions (Addendum)
Medication Instructions:  START- Chlorthalidone 25 mg daily  Labwork: BMP in 1 week  Testing/Procedures: None Ordered  Follow-Up: Your physician recommends that you schedule a follow-up appointment in: 3 Months with Kathleen Stout  Your physician recommends that you schedule a follow-up appointment in: 1 week with Kathleen Stout for Blood Pressure check    Any Other Special Instructions Will Be Listed Below (If Applicable).       Happy Thanksgiving   If you need a refill on your cardiac medications before your next appointment, please call your pharmacy.

## 2016-10-25 ENCOUNTER — Encounter: Payer: Self-pay | Admitting: *Deleted

## 2016-10-25 ENCOUNTER — Ambulatory Visit: Payer: BLUE CROSS/BLUE SHIELD

## 2017-01-04 ENCOUNTER — Encounter (HOSPITAL_COMMUNITY): Payer: Self-pay | Admitting: Radiology

## 2017-01-04 ENCOUNTER — Telehealth (HOSPITAL_COMMUNITY): Payer: Self-pay | Admitting: Radiology

## 2017-01-04 NOTE — Telephone Encounter (Signed)
Phone states this phone can not accept calls at this time. Calling to schedule echocardiogram.

## 2017-02-15 ENCOUNTER — Telehealth: Payer: Self-pay

## 2017-02-15 NOTE — Telephone Encounter (Signed)
Pre Visit Call completed with patient.

## 2017-02-16 ENCOUNTER — Ambulatory Visit: Payer: Self-pay | Admitting: Medical

## 2017-03-15 ENCOUNTER — Ambulatory Visit: Payer: BLUE CROSS/BLUE SHIELD | Attending: Family Medicine | Admitting: Family Medicine

## 2017-03-15 VITALS — BP 141/83 | HR 72 | Temp 97.6°F | Resp 18 | Ht 64.0 in | Wt 317.2 lb

## 2017-03-15 DIAGNOSIS — M238X2 Other internal derangements of left knee: Secondary | ICD-10-CM | POA: Diagnosis not present

## 2017-03-15 DIAGNOSIS — I10 Essential (primary) hypertension: Secondary | ICD-10-CM | POA: Diagnosis not present

## 2017-03-15 DIAGNOSIS — E119 Type 2 diabetes mellitus without complications: Secondary | ICD-10-CM | POA: Diagnosis not present

## 2017-03-15 DIAGNOSIS — Z7984 Long term (current) use of oral hypoglycemic drugs: Secondary | ICD-10-CM | POA: Insufficient documentation

## 2017-03-15 DIAGNOSIS — Z0000 Encounter for general adult medical examination without abnormal findings: Secondary | ICD-10-CM | POA: Diagnosis not present

## 2017-03-15 DIAGNOSIS — G44229 Chronic tension-type headache, not intractable: Secondary | ICD-10-CM | POA: Diagnosis not present

## 2017-03-15 DIAGNOSIS — Z23 Encounter for immunization: Secondary | ICD-10-CM | POA: Insufficient documentation

## 2017-03-15 DIAGNOSIS — Z79899 Other long term (current) drug therapy: Secondary | ICD-10-CM | POA: Diagnosis not present

## 2017-03-15 DIAGNOSIS — M25562 Pain in left knee: Secondary | ICD-10-CM | POA: Insufficient documentation

## 2017-03-15 DIAGNOSIS — G8929 Other chronic pain: Secondary | ICD-10-CM

## 2017-03-15 LAB — GLUCOSE, POCT (MANUAL RESULT ENTRY): POC Glucose: 89 mg/dl (ref 70–99)

## 2017-03-15 LAB — POCT GLYCOSYLATED HEMOGLOBIN (HGB A1C): Hemoglobin A1C: 7.1

## 2017-03-15 MED ORDER — PNEUMOCOCCAL 13-VAL CONJ VACC IM SUSP
0.5000 mL | Freq: Once | INTRAMUSCULAR | Status: AC
  Administered 2017-03-15: 0.5 mL via INTRAMUSCULAR

## 2017-03-15 MED ORDER — LISINOPRIL 40 MG PO TABS: 40.0000 mg | ORAL_TABLET | Freq: Every day | ORAL | 0 refills | Status: DC

## 2017-03-15 MED ORDER — NAPROXEN 500 MG PO TABS: 500.0000 mg | ORAL_TABLET | Freq: Two times a day (BID) | ORAL | 0 refills | Status: DC

## 2017-03-15 MED ORDER — METFORMIN HCL 500 MG PO TABS: 500.0000 mg | ORAL_TABLET | Freq: Two times a day (BID) | ORAL | 0 refills | Status: DC

## 2017-03-15 NOTE — Patient Instructions (Signed)
Knee Pain, Adult Many things can cause knee pain. The pain often goes away on its own with time and rest. If the pain does not go away, tests may be done to find out what is causing the pain. Follow these instructions at home: Activity   Rest your knee.  Do not do things that cause pain.  Avoid activities where both feet leave the ground at the same time (high-impact activities). Examples are running, jumping rope, and doing jumping jacks. General instructions   Take medicines only as told by your doctor.  Raise (elevate) your knee when you are resting. Make sure your knee is higher than your heart.  Sleep with a pillow under your knee.  If told, put ice on the knee:  Put ice in a plastic bag.  Place a towel between your skin and the bag.  Leave the ice on for 20 minutes, 2-3 times a day.  Ask your doctor if you should wear an elastic knee support.  Lose weight if you are overweight. Being overweight can make your knee hurt more.  Do not use any tobacco products. These include cigarettes, chewing tobacco, or electronic cigarettes. If you need help quitting, ask your doctor. Smoking may slow down healing. Contact a doctor if:  The pain does not stop.  The pain changes or gets worse.  You have a fever along with knee pain.  Your knee gives out or locks up.  Your knee swells, and becomes worse. Get help right away if:  Your knee feels warm.  You cannot move your knee.  You have very bad knee pain.  You have chest pain.  You have trouble breathing. Summary  Many things can cause knee pain. The pain often goes away on its own with time and rest.  Avoid activities that put stress on your knee. These include running and jumping rope.  Get help right away if you cannot move your knee, or if your knee feels warm, or if you have trouble breathing. This information is not intended to replace advice given to you by your health care provider. Make sure you discuss any  questions you have with your health care provider. Document Released: 02/09/2009 Document Revised: 11/07/2016 Document Reviewed: 11/07/2016 Elsevier Interactive Patient Education  2017 [employer]   Tension Headache A tension headache is pain, pressure, or aching that is felt over the front and sides of your head. These headaches can last from 30 minutes to several days. Follow these instructions at home: Managing pain   Take over-the-counter and prescription medicines only as told by your doctor.  Lie down in a dark, quiet room when you have a headache.  If directed, apply ice to your head and neck area:  Put ice in a plastic bag.  Place a towel between your skin and the bag.  Leave the ice on for 20 minutes, 2-3 times per day.  Use a heating pad or a hot shower to apply heat to your head and neck area as told by your doctor. Eating and drinking   Eat meals on a regular schedule.  Do not drink a lot of alcohol.  Do not use a lot of caffeine, or stop using caffeine. General instructions   Keep all follow-up visits as told by your doctor. This is important.  Keep a journal to find out if certain things bring on headaches. For example, write down:  What you eat and drink.  How much sleep you get.  Any change  to your diet or medicines.  Try getting a massage, or doing other things that help you to relax.  Lessen stress.  Sit up straight. Do not tighten (tense) your muscles.  Do not use tobacco products. This includes cigarettes, chewing tobacco, or e-cigarettes. If you need help quitting, ask your doctor.  Exercise regularly as told by your doctor.  Get enough sleep. This may mean 7-9 hours of sleep. Contact a doctor if:  Your symptoms are not helped by medicine.  You have a headache that feels different from your usual headache.  You feel sick to your stomach (nauseous) or you throw up (vomit).  You have a fever. Get help right away if:  Your headache  becomes very bad.  You keep throwing up.  You have a stiff neck.  You have trouble seeing.  You have trouble speaking.  You have pain in your eye or ear.  Your muscles are weak or you lose muscle control.  You lose your balance or you have trouble walking.  You feel like you will pass out (faint) or you pass out.  You have confusion. This information is not intended to replace advice given to you by your health care provider. Make sure you discuss any questions you have with your health care provider. Document Released: 02/07/2010 Document Revised: 07/13/2016 Document Reviewed: 03/08/2015 Elsevier Interactive Patient Education  2017 [employer].

## 2017-03-15 NOTE — Progress Notes (Signed)
Patient is here for establish care   Patient complains headaches that its constantly   Patient also complains about left knee pain that comes and goes  Patient also experiencing nausea but nothing comes  Patient been without current medication for a while

## 2017-03-15 NOTE — Progress Notes (Signed)
Subjective:  Patient ID: Kathleen Stout, female    DOB: May 30, 1971  Age: 46 y.o. MRN: [id number]  CC: Diabetes and Hypertension   HPI Kathleen Stout presents for   HTN: Patient here for follow-up of elevated blood pressure. She is not exercising and is not adherent to low salt diet.  Doesn't not have blood pressure cuff at home. Cardiac symptoms none. Patient denies none.  Cardiovascular risk factors: diabetes mellitus, hypertension and sedentary lifestyle. Chronic use of agents associated with hypertension: none. History of target organ damage: none.  DM: Patient presents for follow up of diabetes. Symptoms: paresthesia of the feet. Symptoms have been intermittent. Patient denies foot ulcerations, hypoglycemia , nausea, visual disturbances and vomitting.  Reports history of opthalmology eye exam 2 weeks ago. Evaluation to date has been included: fasting lipid panel and hemoglobin A1C.  Home sugars: patient does not check sugars regularly. Has been without DM supplies for 1 year.  Treatment to date: Metformin which has been pateint reports being without for 1 year.   Knee pain: 6 months. Worsened last week. Denies any history of injury or swelling. Reports history of knee injection 1 year ago. Takes OTC ibuprofen with minimal relief of symptoms.   Headaches: Reports frontal, bilateral headaches. Last episode was Monday. Denies any vision changes, N/V, weakness, or difficulty keeping her balance. Headaches are throbbing and moderate to severe in intensity. Reports recent stressors including job.    Outpatient Medications Prior to Visit  Medication Sig Dispense Refill  . albuterol (PROVENTIL HFA;VENTOLIN HFA) 108 (90 Base) MCG/ACT inhaler Inhale 2 puffs into the lungs every 6 (six) hours as needed for wheezing or shortness of breath.    . Blood Glucose Monitoring Suppl (TRUE METRIX METER) DEVI 1 kit by Does not apply route as directed. 1 Device 0  . glucose blood (TRUE METRIX BLOOD GLUCOSE TEST)  test strip Use as instructed 100 each 12  . TRUEPLUS LANCETS 28G MISC Use as directed 100 each 12  . lisinopril (PRINIVIL,ZESTRIL) 40 MG tablet Take 1 tablet (40 mg total) by mouth daily. 30 tablet 5  . metFORMIN (GLUCOPHAGE) 500 MG tablet Take 1 tablet (500 mg total) by mouth 2 (two) times daily with a meal. 60 tablet 4  . chlorthalidone (HYGROTON) 25 MG tablet Take 1 tablet (25 mg total) by mouth daily. (Patient not taking: Reported on 02/15/2017) 90 tablet 3   Facility-Administered Medications Prior to Visit  Medication Dose Route Frequency Provider Last Rate Last Dose  . technetium tetrofosmin (TC-MYOVIEW) injection 25.8 millicurie  52.7 millicurie Intravenous Once PRN Dorothy Spark, MD       ROS Review of Systems  Constitutional: Negative.   Eyes: Negative.   Respiratory: Negative.   Cardiovascular: Negative.   Gastrointestinal: Negative.   Musculoskeletal: Positive for arthralgias.  Skin: Negative.   Neurological: Positive for headaches.   Objective:  BP (!) 141/83 (BP Location: Left Arm, Patient Position: Sitting, Cuff Size: Large)   Pulse 72   Temp 97.6 F (36.4 C) (Oral)   Resp 18   Ht '5\' 4"'$  (1.626 m)   Wt (!) 317 lb 3.2 oz (143.9 kg)   SpO2 99%   BMI 54.45 kg/m   BP/Weight 03/15/2017 10/18/2016 78/12/4233  Systolic BP 361 443 -  Diastolic BP 83 154 -  Wt. (Lbs) 317.2 318.6 319  BMI 54.45 54.69 54.76   Physical Exam  Constitutional: She is oriented to person, place, and time. She appears well-developed and well-nourished.  HENT:  Head: Normocephalic  and atraumatic.  Right Ear: External ear normal.  Left Ear: External ear normal.  Nose: Nose normal.  Mouth/Throat: Oropharynx is clear and moist.  Eyes: Conjunctivae are normal. Pupils are equal, round, and reactive to light.  Neck: Normal range of motion. Neck supple.  Cardiovascular: Normal rate, regular rhythm, normal heart sounds and intact distal pulses.   Pulmonary/Chest: Effort normal and breath sounds  normal.  Abdominal: Soft. Bowel sounds are normal.  Musculoskeletal:       Left knee: She exhibits bony tenderness. She exhibits normal range of motion and no swelling.  Left knee pain; crepitus with flexion.  Lymphadenopathy:    She has no cervical adenopathy.  Neurological: She is alert and oriented to person, place, and time.  Skin: Skin is warm and dry.  Psychiatric: She has a normal mood and affect.  Nursing note and vitals reviewed.    Assessment & Plan:   Problem List Items Addressed This Visit      Cardiovascular and Mediastinum   Essential hypertension   Relevant Medications   lisinopril (PRINIVIL,ZESTRIL) 40 MG tablet     Endocrine   Type II diabetes mellitus (HCC) - Primary   Relevant Medications   lisinopril (PRINIVIL,ZESTRIL) 40 MG tablet   metFORMIN (GLUCOPHAGE) 500 MG tablet   Other Relevant Orders   CMP14+EGFR (Completed)   Glucose (CBG) (Completed)   Lipid Panel (Completed)   POCT glycosylated hemoglobin (Hb A1C) (Completed)     Other   Knee pain, left   Relevant Medications   naproxen (NAPROSYN) 500 MG tablet   Other Relevant Orders   Ambulatory referral to Orthopedics    Other Visit Diagnoses    Crepitus of joint of left knee       Relevant Medications   naproxen (NAPROSYN) 500 MG tablet   Other Relevant Orders   Ambulatory referral to Orthopedics   Chronic tension-type headache, not intractable       Relevant Medications   naproxen (NAPROSYN) 500 MG tablet   Healthcare maintenance       Relevant Medications   pneumococcal 13-valent conjugate vaccine (PREVNAR 13) injection 0.5 mL (Completed)   Other Relevant Orders   Tdap vaccine greater than or equal to 7yo IM (Completed)      Meds ordered this encounter  Medications  . lisinopril (PRINIVIL,ZESTRIL) 40 MG tablet    Sig: Take 1 tablet (40 mg total) by mouth daily.    Dispense:  90 tablet    Refill:  0    Order Specific Question:   Supervising Provider    Answer:   Tresa Garter [id number]  . metFORMIN (GLUCOPHAGE) 500 MG tablet    Sig: Take 1 tablet (500 mg total) by mouth 2 (two) times daily with a meal.    Dispense:  90 tablet    Refill:  0    Order Specific Question:   Supervising Provider    Answer:   Tresa Garter [id number]  . naproxen (NAPROSYN) 500 MG tablet    Sig: Take 1 tablet (500 mg total) by mouth 2 (two) times daily with a meal.    Dispense:  40 tablet    Refill:  0    Order Specific Question:   Supervising Provider    Answer:   Tresa Garter [id number]  . pneumococcal 13-valent conjugate vaccine (PREVNAR 13) injection 0.5 mL    Follow-up: Return in about 2 weeks (around 03/29/2017) for HTN/ DM .   Alfonse Spruce FNP

## 2017-03-16 LAB — LIPID PANEL
CHOLESTEROL TOTAL: 173 mg/dL (ref 100–199)
Chol/HDL Ratio: 3.5 ratio (ref 0.0–4.4)
HDL: 49 mg/dL (ref >39)
LDL CALC: 111 mg/dL — AB (ref 0–99)
TRIGLYCERIDES: 64 mg/dL (ref 0–149)
VLDL CHOLESTEROL CAL: 13 mg/dL (ref 5–40)

## 2017-03-16 LAB — CMP14+EGFR
ALBUMIN: 4.6 g/dL (ref 3.5–5.5)
ALT: 17 IU/L (ref 0–32)
AST: 23 IU/L (ref 0–40)
Albumin/Globulin Ratio: 1.4 (ref 1.2–2.2)
BUN/Creatinine Ratio: 13 (ref 9–23)
BUN: 13 mg/dL (ref 6–24)
Bilirubin Total: 0.5 mg/dL (ref 0.0–1.2)
CREATININE: 1.03 mg/dL — AB (ref 0.57–1.00)
GFR calc Af Amer: 75 mL/min/{1.73_m2} (ref >59)
GFR calc non Af Amer: 65 mL/min/{1.73_m2} (ref >59)
Globulin, Total: 3.4 g/dL (ref 1.5–4.5)
Glucose: 89 mg/dL (ref 65–99)
Total Protein: 8 g/dL (ref 6.0–8.5)

## 2017-03-22 ENCOUNTER — Telehealth: Payer: Self-pay

## 2017-03-22 ENCOUNTER — Other Ambulatory Visit: Payer: Self-pay | Admitting: Family Medicine

## 2017-03-22 DIAGNOSIS — E119 Type 2 diabetes mellitus without complications: Secondary | ICD-10-CM

## 2017-03-22 NOTE — Telephone Encounter (Signed)
-----   Message from Lizbeth Bark, [place] sent at 03/22/2017 12:23 PM EDT ----- CMP lab that evaluates your fluid and electrolyte balance and assess kidney function could not be resulted. Recommended scheduling lab appointment for re-draw.  Lipid level were elevated. This can increase your risk of heart disease overtime. Start eating a diet low in saturated fat. Limit your intake of fried foods, red meats, and whole milk. Increase physical activity. Recommend recheck in 1 year.

## 2017-03-22 NOTE — Telephone Encounter (Signed)
CMA call patient regarding lab results  Patient did not answer left a VM stating the reason of the call & to call  Back

## 2017-03-22 NOTE — Telephone Encounter (Signed)
Patient return CMA call   Patient VerifY DOB  Patient was aware and understood

## 2017-03-29 ENCOUNTER — Ambulatory Visit: Payer: Self-pay | Admitting: Family Medicine

## 2017-03-29 NOTE — Progress Notes (Deleted)
   Subjective:  Patient ID: Kathleen Stout, female    DOB: [birth year]  Age: 46 y.o. MRN: [id number]  CC: No chief complaint on file.   HPI Kathleen Stout presents for  HPI:  Patient here for follow-up of elevated blood pressure. She {is/is not:9024} exercising and {is/is not:9024} adherent to low salt diet.  Blood pressure {is/is not:9024} well controlled at home. Cardiac symptoms {Symptoms; cardiac:12860}. Patient denies {Symptoms; cardiac:12860}.  Cardiovascular risk factors: {cv risk factors:510}. Use of agents associated with hypertension: {bp agents assoc with hypertension:511::"none"}. History of target organ damage: {target [phone]}.   DM: Diabetes Mellitus: Patient presents {diabetes rfv:14250::"for follow up of diabetes."} Symptoms: {dm sx:14075}. Symptoms have {Desc; symptom progression:19445}. Patient denies {dm sx:14075}.  Evaluation to date has been included: {dm labs:[phone]}.  Home sugars: {dm home sugars:14018}. Treatment to date: {dm interventions:14074}.      Outpatient Medications Prior to Visit  Medication Sig Dispense Refill  . albuterol (PROVENTIL HFA;VENTOLIN HFA) 108 (90 Base) MCG/ACT inhaler Inhale 2 puffs into the lungs every 6 (six) hours as needed for wheezing or shortness of breath.    . Blood Glucose Monitoring Suppl (TRUE METRIX METER) DEVI 1 kit by Does not apply route as directed. 1 Device 0  . chlorthalidone (HYGROTON) 25 MG tablet Take 1 tablet (25 mg total) by mouth daily. (Patient not taking: Reported on 02/15/2017) 90 tablet 3  . glucose blood (TRUE METRIX BLOOD GLUCOSE TEST) test strip Use as instructed 100 each 12  . lisinopril (PRINIVIL,ZESTRIL) 40 MG tablet Take 1 tablet (40 mg total) by mouth daily. 90 tablet 0  . metFORMIN (GLUCOPHAGE) 500 MG tablet Take 1 tablet (500 mg total) by mouth 2 (two) times daily with a meal. 90 tablet 0  . naproxen (NAPROSYN) 500 MG tablet Take 1 tablet (500 mg total) by mouth 2 (two) times daily with a meal. 40  tablet 0  . TRUEPLUS LANCETS 28G MISC Use as directed 100 each 12   Facility-Administered Medications Prior to Visit  Medication Dose Route Frequency Provider Last Rate Last Dose  . technetium tetrofosmin (TC-MYOVIEW) injection 77.8 millicurie  24.2 millicurie Intravenous Once PRN Dorothy Spark, MD        ROS Review of Systems      Objective:  There were no vitals taken for this visit.  BP/Weight 03/15/2017 10/18/2016 35/01/6143  Systolic BP 315 400 -  Diastolic BP 83 867 -  Wt. (Lbs) 317.2 318.6 319  BMI 54.45 54.69 54.76     Physical Exam   Assessment & Plan:   Problem List Items Addressed This Visit    None      No orders of the defined types were placed in this encounter.   Follow-up: No Follow-up on file.   Alfonse Spruce FNP

## 2017-04-12 ENCOUNTER — Encounter (HOSPITAL_COMMUNITY): Payer: Self-pay | Admitting: Emergency Medicine

## 2017-04-12 ENCOUNTER — Emergency Department (HOSPITAL_COMMUNITY)
Admission: EM | Admit: 2017-04-12 | Discharge: 2017-04-12 | Disposition: A | Discharge status: Home / Self Care | Payer: BLUE CROSS/BLUE SHIELD | Attending: Emergency Medicine | Admitting: Emergency Medicine

## 2017-04-12 ENCOUNTER — Ambulatory Visit: Payer: BLUE CROSS/BLUE SHIELD | Admitting: Sports Medicine

## 2017-04-12 DIAGNOSIS — M545 Low back pain: Secondary | ICD-10-CM | POA: Insufficient documentation

## 2017-04-12 DIAGNOSIS — Z5321 Procedure and treatment not carried out due to patient leaving prior to being seen by health care provider: Secondary | ICD-10-CM | POA: Diagnosis not present

## 2017-04-12 LAB — COMPREHENSIVE METABOLIC PANEL
ALBUMIN: 3.7 g/dL (ref 3.5–5.0)
ALK PHOS: 72 U/L (ref 38–126)
ALT: 25 U/L (ref 14–54)
AST: 22 U/L (ref 15–41)
Anion gap: 8 (ref 5–15)
BUN: 12 mg/dL (ref 6–20)
CALCIUM: 9.2 mg/dL (ref 8.9–10.3)
CO2: 26 mmol/L (ref 22–32)
CREATININE: 0.82 mg/dL (ref 0.44–1.00)
Chloride: 105 mmol/L (ref 101–111)
GFR calc Af Amer: >60 mL/min (ref >60)
GFR calc non Af Amer: >60 mL/min (ref >60)
GLUCOSE: 112 mg/dL — AB (ref 65–99)
Potassium: 3.7 mmol/L (ref 3.5–5.1)
SODIUM: 139 mmol/L (ref 135–145)
Total Bilirubin: 0.6 mg/dL (ref 0.3–1.2)
Total Protein: 7.2 g/dL (ref 6.5–8.1)

## 2017-04-12 LAB — CBC WITH DIFFERENTIAL/PLATELET
BASOS PCT: 0 %
Basophils Absolute: 0 10*3/uL (ref 0.0–0.1)
EOS ABS: 0.1 10*3/uL (ref 0.0–0.7)
Eosinophils Relative: 1 %
HEMATOCRIT: 42.1 % (ref 36.0–46.0)
HEMOGLOBIN: 14.3 g/dL (ref 12.0–15.0)
LYMPHS ABS: 2.7 10*3/uL (ref 0.7–4.0)
Lymphocytes Relative: 28 %
MCH: 29.8 pg (ref 26.0–34.0)
MCHC: 34 g/dL (ref 30.0–36.0)
MCV: 87.7 fL (ref 78.0–100.0)
Monocytes Absolute: 0.5 10*3/uL (ref 0.1–1.0)
Monocytes Relative: 6 %
NEUTROS ABS: 6.3 10*3/uL (ref 1.7–7.7)
NEUTROS PCT: 65 %
Platelets: 225 10*3/uL (ref 150–400)
RBC: 4.8 MIL/uL (ref 3.87–5.11)
RDW: 14 % (ref 11.5–15.5)
WBC: 9.6 10*3/uL (ref 4.0–10.5)

## 2017-04-12 NOTE — ED Notes (Signed)
Pt called for vitals recheck x3. No answer.  

## 2017-04-12 NOTE — ED Triage Notes (Signed)
[facility] and noted to have htn today; pt recently had increase in htn meds

## 2017-04-12 NOTE — ED Notes (Signed)
Lobby updated with announcement by RN, apologies for delays provided. 

## 2017-04-12 NOTE — ED Notes (Signed)
Patient did not answer when called for rooming.   

## 2018-05-28 NOTE — Progress Notes (Signed)
Subjective:    Patient ID: Kathleen Stout, female    DOB: 01/01/1971, 47 y.o.   MRN: [id number]  HPI Chief Complaint  Patient presents with  . new pt    new pt cpe, arm pain and breast pain. eye exam done within a year   She is new to the practice.  Here for a complete physical exam and for chronic health conditions.  Previous medical care: Urgent cares for the past year. Prior to that she was going to the [facility] due to no health insurance.  Moved from [place] 5 years ago.  Last CPE: 11/2016   Other providers:  Cardiologist in the past for chest pain. Negative work up. Stress test.  Orthopedist- [facility]-  for knee pain in the past.   Diabetes- diagnosed in [place] in 2012. Checks her blood sugars every other day.  Denies ever seeing a diabetes nutritionist.   HTN- diagnosed in 2012 as well. Does not check BP at home.   Reports doing well on medications, misses doses every now and then.   History of depression since age 47. Medication at that time and none since. No counseling since that time. States she attempted suicide in her 8320s a couple of times.  States she is depressed because of her son. He was recently let out of prison.  No thoughts of SI or HI.   Questions whether she has bipolar.    Social history: Lives with her boyfriend. 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  Denies smoking, drinking alcohol, drug use  Diet: unhealthy diet.  Excerise: nothing in past 8 months   Declines STD testing.   Immunizations: up to date   Health maintenance:  Mammogram: never had one  Colonoscopy: never  Last Gynecological Exam: hysterectomy  Last Menstrual cycle: hysterectomy in 2013 for heavy bleeding.  Last Dental Exam: one year ago  Last Eye Exam: one year ago   [facility] 05/29/2018 03/15/2017 06/08/2016 07/30/2015 05/04/2015  Decreased Interest  1 0 3 0 0  Down, Depressed, Hopeless 3 1 0 0 0  PHQ - 2 Score 4 1 3  0 0  Altered sleeping 3 1 0 - -  Tired, decreased energy 3 1 1  - -  Change in appetite 3 0 0 - -  Feeling bad or failure about yourself  3 0 0 - -  Trouble concentrating 3 0 0 - -  Moving slowly or fidgety/restless 1 0 0 - -  Suicidal thoughts 0 0 0 - -  PHQ-9 Score 20 3 4  - -  Difficult doing work/chores Not difficult at all - Not difficult at all - -     Wears seatbelt always, smoke detectors in home and functioning, does not text while driving and feels safe in home environment.   Reviewed allergies, medications, past medical, surgical, family, and social history.   Review of Systems Review of Systems Constitutional: -fever, -chills, -sweats, -unexpected weight change,-fatigue ENT: -runny nose, -ear pain, -sore throat Cardiology:  -chest pain, -palpitations, -edema Respiratory: -cough, -shortness of breath, -wheezing Gastroenterology: -abdominal pain, -nausea, -vomiting, -diarrhea, -constipation  Hematology: -bleeding or bruising problems Musculoskeletal: -arthralgias, -myalgias, -joint swelling, -back pain Ophthalmology: -vision changes Urology: -dysuria, -difficulty urinating, -hematuria, -urinary frequency, -urgency Neurology: -headache, -weakness, -tingling, -numbness       Objective:   Physical Exam BP 120/82   Pulse  79   Ht 5\' 4"  (1.626 m)   Wt (!) 321 lb 12.8 oz (146 kg)   BMI 55.24 kg/m   General Appearance:    Alert, cooperative, no distress, appears stated age  Head:    Normocephalic, without obvious abnormality, atraumatic  Eyes:    PERRL, conjunctiva/corneas clear, EOM's intact, fundi    benign  Ears:    Normal TM's and external ear canals  Nose:   Nares normal, mucosa normal, no drainage or sinus   tenderness  Throat:   Lips, mucosa, and tongue normal; teeth and gums normal  Neck:   Supple, no lymphadenopathy;  thyroid:  no   enlargement/tenderness/nodules; no carotid   bruit or JVD   Back:    Spine nontender, no curvature, ROM normal, no CVA     tenderness  Lungs:     Clear to auscultation bilaterally without wheezes, rales or     ronchi; respirations unlabored  Chest Wall:    No tenderness or deformity   Heart:    Regular rate and rhythm, S1 and S2 normal, no murmur, rub   or gallop  Breast Exam:    Declines. Mammogram ordered.   Abdomen:     Soft, non-tender, nondistended, normoactive bowel sounds,    no masses, no hepatosplenomegaly  Genitalia:    Declines. Pap not indicated.      Extremities:   No clubbing, cyanosis or edema  Pulses:   2+ and symmetric all extremities  Skin:   Skin color, texture, turgor normal, no rashes or lesions  Lymph nodes:   Cervical, supraclavicular, and axillary nodes normal  Neurologic:   CNII-XII intact, normal strength, sensation and gait; reflexes 2+ and symmetric throughout          Psych:   Normal mood, affect, hygiene and grooming.    Urinalysis dipstick: negative       Assessment & Plan:  Routine general medical examination at a health care facility - Plan: POCT Urinalysis DIP (Proadvantage Device), CBC with Differential/Platelet, Comprehensive metabolic panel, TSH, Lipid panel  Diabetes mellitus type 2 in obese (HCC) - Plan: Microalbumin/Creatinine Ratio, Urine, HgB A1c, Amb ref to Medical Nutrition Therapy-MNT  Essential hypertension  Morbid obesity with BMI of 50.0-59.9, adult (HCC) - Plan: TSH, Lipid panel, Amb ref to Medical Nutrition Therapy-MNT  Screening for breast cancer - Plan: MM DIGITAL SCREENING BILATERAL  Screening for lipid disorders - Plan: Lipid panel  Depression, recurrent (HCC)  Screening for HIV without presence of risk factors - Plan: HIV antibody  Hgb A1c 7.7%. Increased Metformin to 1,000 mg twice daily and added a GLP-1 Trulicity. She is motivated to improve her overall health and lose weight.  Education on diabetes and management. She did not have much knowledge.  Referral to MNT.  Urine  microalbumin ordered.  Foot exam done.  Encouraged her to schedule a diabetic eye exam.  HTN- well controlled. Continue on medication. Low sodium diet recommended.  Depression- recommend she see a Veterinary surgeon. No danger to herself.  Immunizations up to date.  Mammogram ordered.  Discussed safety and health promotion.  Follow up pending labs or in 2 weeks.

## 2018-05-29 ENCOUNTER — Ambulatory Visit (INDEPENDENT_AMBULATORY_CARE_PROVIDER_SITE_OTHER): Payer: Managed Care, Other (non HMO) | Admitting: Family Medicine

## 2018-05-29 ENCOUNTER — Encounter: Payer: Self-pay | Admitting: Family Medicine

## 2018-05-29 VITALS — BP 120/82 | HR 79 | Ht 64.0 in | Wt 321.8 lb

## 2018-05-29 DIAGNOSIS — Z1239 Encounter for other screening for malignant neoplasm of breast: Secondary | ICD-10-CM

## 2018-05-29 DIAGNOSIS — Z13220 Encounter for screening for lipoid disorders: Secondary | ICD-10-CM

## 2018-05-29 DIAGNOSIS — E1169 Type 2 diabetes mellitus with other specified complication: Secondary | ICD-10-CM | POA: Diagnosis not present

## 2018-05-29 DIAGNOSIS — E669 Obesity, unspecified: Secondary | ICD-10-CM

## 2018-05-29 DIAGNOSIS — Z0000 Encounter for general adult medical examination without abnormal findings: Secondary | ICD-10-CM

## 2018-05-29 DIAGNOSIS — F339 Major depressive disorder, recurrent, unspecified: Secondary | ICD-10-CM | POA: Diagnosis not present

## 2018-05-29 DIAGNOSIS — Z1231 Encounter for screening mammogram for malignant neoplasm of breast: Secondary | ICD-10-CM

## 2018-05-29 DIAGNOSIS — Z6841 Body Mass Index (BMI) 40.0 and over, adult: Secondary | ICD-10-CM | POA: Diagnosis not present

## 2018-05-29 DIAGNOSIS — Z114 Encounter for screening for human immunodeficiency virus [HIV]: Secondary | ICD-10-CM

## 2018-05-29 DIAGNOSIS — I10 Essential (primary) hypertension: Secondary | ICD-10-CM | POA: Diagnosis not present

## 2018-05-29 LAB — POCT URINALYSIS DIP (PROADVANTAGE DEVICE)
BILIRUBIN UA: NEGATIVE mg/dL
Bilirubin, UA: NEGATIVE
Blood, UA: NEGATIVE
Glucose, UA: NEGATIVE mg/dL
LEUKOCYTES UA: NEGATIVE
Nitrite, UA: NEGATIVE
PROTEIN UA: NEGATIVE mg/dL
Specific Gravity, Urine: 1.025
UUROB: NEGATIVE
pH, UA: 6 (ref 5.0–8.0)

## 2018-05-29 LAB — POCT GLYCOSYLATED HEMOGLOBIN (HGB A1C): HEMOGLOBIN A1C: 7.7 % — AB (ref 4.0–5.6)

## 2018-05-29 MED ORDER — DULAGLUTIDE 0.75 MG/0.5ML ~~LOC~~ SOAJ: SUBCUTANEOUS | 3 refills | Status: DC

## 2018-05-29 MED ORDER — METFORMIN HCL 1000 MG PO TABS: 1000.0000 mg | ORAL_TABLET | Freq: Two times a day (BID) | ORAL | 3 refills | Status: DC

## 2018-05-29 NOTE — Patient Instructions (Addendum)
Check your blood sugar either fasting and the reading we want is between 80-130. You can also check your blood sugar 2 hours after a meal and the goal range is 130-160. If you are seeing readings greater than 200 then this is too high.   I am increasing your Metformin to 1,000 mg twice daily with meals.  You will also start on ONCE WEEKLY Trulicity for your diabetes. This must be given at the same time each week.   You will receive a call from the medical nutritionist to schedule an appointment to help you with your diet and diabetes.   Call and schedule a diabetic eye exam.   Call and schedule your mammogram.    You can call to schedule your appointment with the Counselor. A few offices are listed below for you to call.    [facility]  [address], [address], [place], [place] [address]  Phone: [phone]  [facility] [address] [place], [place] [address]  Phone: [phone]  [facility]  [address], [place], [place] [address] Phone: [phone]

## 2018-05-30 LAB — COMPREHENSIVE METABOLIC PANEL
ALT: 23 IU/L (ref 0–32)
AST: 17 IU/L (ref 0–40)
Albumin/Globulin Ratio: 1.3 (ref 1.2–2.2)
Albumin: 4.1 g/dL (ref 3.5–5.5)
Alkaline Phosphatase: 83 IU/L (ref 39–117)
BUN/Creatinine Ratio: 10 (ref 9–23)
BUN: 8 mg/dL (ref 6–24)
Bilirubin Total: 0.5 mg/dL (ref 0.0–1.2)
CALCIUM: 9.3 mg/dL (ref 8.7–10.2)
CO2: 25 mmol/L (ref 20–29)
CREATININE: 0.82 mg/dL (ref 0.57–1.00)
Chloride: 99 mmol/L (ref 96–106)
GFR, EST AFRICAN AMERICAN: 98 mL/min/{1.73_m2} (ref >59)
GFR, EST NON AFRICAN AMERICAN: 85 mL/min/{1.73_m2} (ref >59)
GLUCOSE: 167 mg/dL — AB (ref 65–99)
Globulin, Total: 3.1 g/dL (ref 1.5–4.5)
Potassium: 4.1 mmol/L (ref 3.5–5.2)
Sodium: 138 mmol/L (ref 134–144)
TOTAL PROTEIN: 7.2 g/dL (ref 6.0–8.5)

## 2018-05-30 LAB — LIPID PANEL
CHOLESTEROL TOTAL: 168 mg/dL (ref 100–199)
Chol/HDL Ratio: 3.4 ratio (ref 0.0–4.4)
HDL: 50 mg/dL (ref >39)
LDL CALC: 102 mg/dL — AB (ref 0–99)
TRIGLYCERIDES: 81 mg/dL (ref 0–149)
VLDL CHOLESTEROL CAL: 16 mg/dL (ref 5–40)

## 2018-05-30 LAB — CBC WITH DIFFERENTIAL/PLATELET
BASOS ABS: 0 10*3/uL (ref 0.0–0.2)
BASOS: 1 %
EOS (ABSOLUTE): 0.1 10*3/uL (ref 0.0–0.4)
Eos: 1 %
Hematocrit: 43.5 % (ref 34.0–46.6)
Hemoglobin: 14.5 g/dL (ref 11.1–15.9)
IMMATURE GRANS (ABS): 0 10*3/uL (ref 0.0–0.1)
IMMATURE GRANULOCYTES: 0 %
Lymphocytes Absolute: 2.3 10*3/uL (ref 0.7–3.1)
Lymphs: 28 %
MCH: 29.1 pg (ref 26.6–33.0)
MCHC: 33.3 g/dL (ref 31.5–35.7)
MCV: 87 fL (ref 79–97)
Monocytes Absolute: 0.5 10*3/uL (ref 0.1–0.9)
Monocytes: 7 %
Neutrophils Absolute: 5.2 10*3/uL (ref 1.4–7.0)
Neutrophils: 63 %
PLATELETS: 263 10*3/uL (ref 150–450)
RBC: 4.99 x10E6/uL (ref 3.77–5.28)
RDW: 12.9 % (ref 12.3–15.4)
WBC: 8.2 10*3/uL (ref 3.4–10.8)

## 2018-05-30 LAB — MICROALBUMIN / CREATININE URINE RATIO
CREATININE, UR: 139.3 mg/dL
MICROALBUM., U, RANDOM: 18 ug/mL
Microalb/Creat Ratio: 12.9 mg/g creat (ref 0.0–30.0)

## 2018-05-30 LAB — HIV ANTIBODY (ROUTINE TESTING W REFLEX): HIV SCREEN 4TH GENERATION: NONREACTIVE

## 2018-05-30 LAB — TSH: TSH: 2.64 u[IU]/mL (ref 0.450–4.500)

## 2018-06-05 ENCOUNTER — Encounter: Payer: Self-pay | Admitting: Family Medicine

## 2018-06-12 ENCOUNTER — Ambulatory Visit: Payer: Managed Care, Other (non HMO) | Admitting: Family Medicine

## 2018-06-12 ENCOUNTER — Encounter: Payer: Self-pay | Admitting: Family Medicine

## 2018-06-12 VITALS — BP 140/98 | HR 80 | Wt 323.0 lb

## 2018-06-12 DIAGNOSIS — E1169 Type 2 diabetes mellitus with other specified complication: Secondary | ICD-10-CM

## 2018-06-12 DIAGNOSIS — E7800 Pure hypercholesterolemia, unspecified: Secondary | ICD-10-CM

## 2018-06-12 DIAGNOSIS — E669 Obesity, unspecified: Secondary | ICD-10-CM | POA: Diagnosis not present

## 2018-06-12 DIAGNOSIS — I10 Essential (primary) hypertension: Secondary | ICD-10-CM | POA: Diagnosis not present

## 2018-06-12 MED ORDER — SIMVASTATIN 10 MG PO TABS: 10.0000 mg | ORAL_TABLET | Freq: Every day | ORAL | 1 refills | Status: DC

## 2018-06-12 MED ORDER — LISINOPRIL-HYDROCHLOROTHIAZIDE 10-12.5 MG PO TABS: 1.0000 | ORAL_TABLET | Freq: Every day | ORAL | 1 refills | Status: DC

## 2018-06-12 NOTE — Progress Notes (Signed)
   Subjective:    Patient ID: Kathleen Stout, female    DOB: 06/09/1971, 47 y.o.   MRN: [id number]  HPI Chief Complaint  Patient presents with  . 2 week follow-up    blood sugars are doing well. highest- 216   lowest- 113   She is here for a 2 week follow up on diabetes.  FBS at home has been 147, 156, 117, 174, 156, 202, 143, 123, 216, 128, 133, 145.  2 hours post prandial readings: 177, 175. 129, 181, 165, 119, 113.   She can tell how her FBS is affected by what she ate the night prior. Has made some dietary changes. Cut out soda 3 days ago.   Hgb A1c 7.7% -2 weeks ago.  We increased her Metformin to 1,000 mg bid and added Trulicity.  She reports having nausea and loose stools but this is improving. No concerns. Would like to continue on current medications.   Referral made to MNT- has appt on August 1st, 8th and 15th.   HTN- she was previously on chlorthalidone 25 mg but ran out several months ago. She does not check her BP at home.   Depression- states this is improving. Mood is not as down. Has not scheduled appt with therapist.   Mammogram is scheduled for next Tuesday.  Eye exam- not scheduled.   Hysterectomy in past.   Denies fever, chills, dizziness, chest pain, palpitations, shortness of breath, abdominal pain, N/V/D, urinary symptoms, LE edema.   Reviewed allergies, medications, past medical, surgical, family, and social history.    Review of Systems Pertinent positives and negatives in the history of present illness.     Objective:   Physical Exam BP (!) 140/98   Pulse 80   Wt (!) 323 lb (146.5 kg)   BMI 55.44 kg/m   Alert and oriented and in no acute distress. Not otherwise examined.       Assessment & Plan:  Diabetes mellitus type 2 in obese (HCC) - Plan: lisinopril-hydrochlorothiazide (PRINZIDE,ZESTORETIC) 10-12.5 MG tablet, simvastatin (ZOCOR) 10 MG tablet  Essential hypertension - Plan: lisinopril-hydrochlorothiazide (PRINZIDE,ZESTORETIC) 10-12.5  MG tablet  Elevated LDL cholesterol level - Plan: simvastatin (ZOCOR) 10 MG tablet  Diabetes - reports doing well on new medications. Continue. Counseling on diet and exercise. We again discussed cutting carbohydrates and I congratulated her on stopping sodas. Continue checking BS.  Scheduled to see the MNT.  She is aware that she needs to schedule a diabetic eye exam. A list of eye doctors provided.  HTN- start on lisinopril/HCTZ. Low sodium counseling and DASH diet handout given.  Started on statin due to diabetes. Denies being on a statin in the past.  Depression improving. Encouraged her to call and schedule with a counselor.  Scheduled for mammogram.  Return in 4 weeks due to new medications and HTN recheck. Follow up on diabetes also.

## 2018-06-12 NOTE — Patient Instructions (Addendum)
Call and schedule a DIABETIC eye exam.  Here is a list of Eye Doctors that you call and schedule an appointment with. [facility] Address: [address], [place], [place] [address] Phone: [phone]  Dr. Dione Booze Address: [address] [place], [place] [address] Phone: [phone]  [facility] [address] [place], [place]  [address] Telephone: [phone]  [facility] Address: [address] [place], [place] [address] Phone: [phone]    Your BP is elevated today at 140/98. Since you have a history of hypertension,  I am starting you on medication to control your BP. It is called lisinopril/HCTZ.  Start checking your BP at home and keep a record of these.   I am also starting you on a low dose cholesterol medications as well.    DASH Eating Plan DASH stands for "Dietary Approaches to Stop Hypertension." The DASH eating plan is a healthy eating plan that has been shown to reduce high blood pressure (hypertension). It may also reduce your risk for type 2 diabetes, heart disease, and stroke. The DASH eating plan may also help with weight loss. What are tips for following this plan? General guidelines  Avoid eating more than 2,300 mg (milligrams) of salt (sodium) a day. If you have hypertension, you may need to reduce your sodium intake to 1,500 mg a day.  Limit alcohol intake to no more than 1 drink a day for nonpregnant women and 2 drinks a day for men. One drink equals 12 oz of beer, 5 oz of wine, or 1 oz of hard liquor.  Work with your health care provider to maintain a healthy body weight or to lose weight. Ask what an ideal weight is for you.  Get at least 30 minutes of exercise that causes your heart to beat faster (aerobic exercise) most days of the week. Activities may include walking, swimming, or biking.  Work with your health care provider or diet and nutrition specialist (dietitian) to adjust your eating plan to your  individual calorie needs. Reading food labels  Check food labels for the amount of sodium per serving. Choose foods with less than 5 percent of the Daily Value of sodium. Generally, foods with less than 300 mg of sodium per serving fit into this eating plan.  To find whole grains, look for the word "whole" as the first word in the ingredient list. Shopping  Buy products labeled as "low-sodium" or "no salt added."  Buy fresh foods. Avoid canned foods and premade or frozen meals. Cooking  Avoid adding salt when cooking. Use salt-free seasonings or herbs instead of table salt or sea salt. Check with your health care provider or pharmacist before using salt substitutes.  Do not fry foods. Cook foods using healthy methods such as baking, boiling, grilling, and broiling instead.  Cook with heart-healthy oils, such as olive, canola, soybean, or sunflower oil. Meal planning   Eat a balanced diet that includes: ? 5 or more servings of fruits and vegetables each day. At each meal, try to fill half of your plate with fruits and vegetables. ? Up to 6-8 servings of whole grains each day. ? Less than 6 oz of lean meat, poultry, or fish each day. A 3-oz serving of meat is about the same size as a deck of cards. One egg equals 1 oz. ? 2 servings of low-fat dairy each day. ? A serving of nuts, seeds, or beans 5 times  each week. ? Heart-healthy fats. Healthy fats called Omega-3 fatty acids are found in foods such as flaxseeds and coldwater fish, like sardines, salmon, and mackerel.  Limit how much you eat of the following: ? Canned or prepackaged foods. ? Food that is high in trans fat, such as fried foods. ? Food that is high in saturated fat, such as fatty meat. ? Sweets, desserts, sugary drinks, and other foods with added sugar. ? Full-fat dairy products.  Do not salt foods before eating.  Try to eat at least 2 vegetarian meals each week.  Eat more home-cooked food and less restaurant,  buffet, and fast food.  When eating at a restaurant, ask that your food be prepared with less salt or no salt, if possible. What foods are recommended? The items listed may not be a complete list. Talk with your dietitian about what dietary choices are best for you. Grains Whole-grain or whole-wheat bread. Whole-grain or whole-wheat pasta. Brown rice. [place]. Bulgur. Whole-grain and low-sodium cereals. Pita bread. Low-fat, low-sodium crackers. Whole-wheat flour tortillas. Vegetables Fresh or frozen vegetables (raw, steamed, roasted, or grilled). Low-sodium or reduced-sodium tomato and vegetable juice. Low-sodium or reduced-sodium tomato sauce and tomato paste. Low-sodium or reduced-sodium canned vegetables. Fruits All fresh, dried, or frozen fruit. Canned fruit in natural juice (without added sugar). Meat and other protein foods Skinless chicken or [place]. Ground chicken or [place]. Pork with fat trimmed off. Fish and seafood. Egg whites. Dried beans, peas, or lentils. Unsalted nuts, nut butters, and seeds. Unsalted canned beans. Lean cuts of beef with fat trimmed off. Low-sodium, lean deli meat. Dairy Low-fat (1%) or fat-free (skim) milk. Fat-free, low-fat, or reduced-fat cheeses. Nonfat, low-sodium ricotta or cottage cheese. Low-fat or nonfat yogurt. Low-fat, low-sodium cheese. Fats and oils Soft margarine without trans fats. Vegetable oil. Low-fat, reduced-fat, or light mayonnaise and salad dressings (reduced-sodium). Canola, safflower, olive, soybean, and sunflower oils. Avocado. Seasoning and other foods Herbs. Spices. Seasoning mixes without salt. Unsalted popcorn and pretzels. Fat-free sweets. What foods are not recommended? The items listed may not be a complete list. Talk with your dietitian about what dietary choices are best for you. Grains Baked goods made with fat, such as croissants, muffins, or some breads. Dry pasta or rice meal packs. Vegetables Creamed or fried  vegetables. Vegetables in a cheese sauce. Regular canned vegetables (not low-sodium or reduced-sodium). Regular canned tomato sauce and paste (not low-sodium or reduced-sodium). Regular tomato and vegetable juice (not low-sodium or reduced-sodium). Angie Fava. Olives. Fruits Canned fruit in a light or heavy syrup. Fried fruit. Fruit in cream or butter sauce. Meat and other protein foods Fatty cuts of meat. Ribs. Fried meat. Berniece Salines. Sausage. Bologna and other processed lunch meats. Salami. Fatback. Hotdogs. Bratwurst. Salted nuts and seeds. Canned beans with added salt. Canned or smoked fish. Whole eggs or egg yolks. Chicken or [place] with skin. Dairy Whole or 2% milk, cream, and half-and-half. Whole or full-fat cream cheese. Whole-fat or sweetened yogurt. Full-fat cheese. Nondairy creamers. Whipped toppings. Processed cheese and cheese spreads. Fats and oils Butter. Stick margarine. Lard. Shortening. Ghee. Bacon fat. Tropical oils, such as coconut, palm kernel, or palm oil. Seasoning and other foods Salted popcorn and pretzels. Onion salt, garlic salt, seasoned salt, table salt, and sea salt. Worcestershire sauce. Tartar sauce. Barbecue sauce. Teriyaki sauce. Soy sauce, including reduced-sodium. Steak sauce. Canned and packaged gravies. Fish sauce. Oyster sauce. Cocktail sauce. Horseradish that you find on the shelf. Ketchup. Mustard. Meat flavorings and tenderizers. Bouillon cubes. Hot sauce  and Tabasco sauce. Premade or packaged marinades. Premade or packaged taco seasonings. Relishes. Regular salad dressings. Where to find more information:  National Heart, Lung, and Blood Institute: PopSteam.iswww.nhlbi.nih.gov  American Heart Association: www.heart.org Summary  The DASH eating plan is a healthy eating plan that has been shown to reduce high blood pressure (hypertension). It may also reduce your risk for type 2 diabetes, heart disease, and stroke.  With the DASH eating plan, you should limit salt (sodium)  intake to 2,300 mg a day. If you have hypertension, you may need to reduce your sodium intake to 1,500 mg a day.  When on the DASH eating plan, aim to eat more fresh fruits and vegetables, whole grains, lean proteins, low-fat dairy, and heart-healthy fats.  Work with your health care provider or diet and nutrition specialist (dietitian) to adjust your eating plan to your individual calorie needs. This information is not intended to replace advice given to you by your health care provider. Make sure you discuss any questions you have with your health care provider. Document Released: 11/02/2011 Document Revised: 11/06/2016 Document Reviewed: 11/06/2016 Elsevier Interactive Patient Education  [employer]2018 Elsevier Inc.

## 2018-06-18 ENCOUNTER — Ambulatory Visit: Payer: Self-pay

## 2018-06-27 ENCOUNTER — Ambulatory Visit: Payer: Self-pay | Admitting: Dietician

## 2018-07-04 ENCOUNTER — Ambulatory Visit: Payer: Self-pay

## 2018-07-11 ENCOUNTER — Ambulatory Visit: Payer: Self-pay

## 2018-07-12 ENCOUNTER — Ambulatory Visit
Admission: RE | Admit: 2018-07-12 | Discharge: 2018-07-12 | Disposition: A | Discharge status: Home / Self Care | Payer: Managed Care, Other (non HMO) | Source: Ambulatory Visit | Attending: Family Medicine | Admitting: Family Medicine

## 2018-07-12 ENCOUNTER — Ambulatory Visit: Payer: Self-pay | Admitting: Family Medicine

## 2018-07-12 DIAGNOSIS — Z1239 Encounter for other screening for malignant neoplasm of breast: Secondary | ICD-10-CM

## 2018-07-15 ENCOUNTER — Other Ambulatory Visit: Payer: Self-pay | Admitting: Family Medicine

## 2018-07-15 DIAGNOSIS — R928 Other abnormal and inconclusive findings on diagnostic imaging of breast: Secondary | ICD-10-CM

## 2018-07-19 ENCOUNTER — Other Ambulatory Visit: Payer: Self-pay | Admitting: Family Medicine

## 2018-07-19 ENCOUNTER — Ambulatory Visit
Admission: RE | Admit: 2018-07-19 | Discharge: 2018-07-19 | Disposition: A | Discharge status: Home / Self Care | Payer: Managed Care, Other (non HMO) | Source: Ambulatory Visit | Attending: Family Medicine | Admitting: Family Medicine

## 2018-07-19 DIAGNOSIS — R928 Other abnormal and inconclusive findings on diagnostic imaging of breast: Secondary | ICD-10-CM

## 2018-07-19 DIAGNOSIS — R921 Mammographic calcification found on diagnostic imaging of breast: Secondary | ICD-10-CM

## 2018-11-23 ENCOUNTER — Other Ambulatory Visit: Payer: Self-pay

## 2018-11-23 ENCOUNTER — Encounter (HOSPITAL_COMMUNITY): Payer: Self-pay

## 2018-11-23 ENCOUNTER — Emergency Department (HOSPITAL_COMMUNITY)
Admission: EM | Admit: 2018-11-23 | Discharge: 2018-11-23 | Disposition: A | Discharge status: Home / Self Care | Payer: Managed Care, Other (non HMO) | Attending: Emergency Medicine | Admitting: Emergency Medicine

## 2018-11-23 ENCOUNTER — Emergency Department (HOSPITAL_COMMUNITY): Payer: Managed Care, Other (non HMO)

## 2018-11-23 DIAGNOSIS — J181 Lobar pneumonia, unspecified organism: Secondary | ICD-10-CM | POA: Diagnosis not present

## 2018-11-23 DIAGNOSIS — Z7984 Long term (current) use of oral hypoglycemic drugs: Secondary | ICD-10-CM | POA: Insufficient documentation

## 2018-11-23 DIAGNOSIS — J101 Influenza due to other identified influenza virus with other respiratory manifestations: Secondary | ICD-10-CM | POA: Diagnosis not present

## 2018-11-23 DIAGNOSIS — Z79899 Other long term (current) drug therapy: Secondary | ICD-10-CM | POA: Insufficient documentation

## 2018-11-23 DIAGNOSIS — E119 Type 2 diabetes mellitus without complications: Secondary | ICD-10-CM | POA: Insufficient documentation

## 2018-11-23 DIAGNOSIS — I10 Essential (primary) hypertension: Secondary | ICD-10-CM | POA: Diagnosis not present

## 2018-11-23 DIAGNOSIS — J189 Pneumonia, unspecified organism: Secondary | ICD-10-CM

## 2018-11-23 DIAGNOSIS — R0602 Shortness of breath: Secondary | ICD-10-CM | POA: Diagnosis present

## 2018-11-23 LAB — COMPREHENSIVE METABOLIC PANEL
ALK PHOS: 55 U/L (ref 38–126)
ALT: 24 U/L (ref 0–44)
AST: 20 U/L (ref 15–41)
Albumin: 3.3 g/dL — ABNORMAL LOW (ref 3.5–5.0)
Anion gap: 9 (ref 5–15)
BUN: 8 mg/dL (ref 6–20)
CALCIUM: 8.4 mg/dL — AB (ref 8.9–10.3)
CHLORIDE: 106 mmol/L (ref 98–111)
CO2: 23 mmol/L (ref 22–32)
CREATININE: 0.86 mg/dL (ref 0.44–1.00)
GFR calc Af Amer: >60 mL/min (ref >60)
Glucose, Bld: 150 mg/dL — ABNORMAL HIGH (ref 70–99)
Potassium: 3.8 mmol/L (ref 3.5–5.1)
Sodium: 138 mmol/L (ref 135–145)
Total Bilirubin: 0.7 mg/dL (ref 0.3–1.2)
Total Protein: 6.6 g/dL (ref 6.5–8.1)

## 2018-11-23 LAB — CBC WITH DIFFERENTIAL/PLATELET
Abs Immature Granulocytes: 0.03 10*3/uL (ref 0.00–0.07)
Basophils Absolute: 0 10*3/uL (ref 0.0–0.1)
Basophils Relative: 0 %
EOS PCT: 2 %
Eosinophils Absolute: 0.2 10*3/uL (ref 0.0–0.5)
HEMATOCRIT: 44.7 % (ref 36.0–46.0)
HEMOGLOBIN: 14.5 g/dL (ref 12.0–15.0)
Immature Granulocytes: 0 %
LYMPHS ABS: 2.3 10*3/uL (ref 0.7–4.0)
LYMPHS PCT: 30 %
MCH: 28.8 pg (ref 26.0–34.0)
MCHC: 32.4 g/dL (ref 30.0–36.0)
MCV: 88.9 fL (ref 80.0–100.0)
MONO ABS: 0.7 10*3/uL (ref 0.1–1.0)
MONOS PCT: 8 %
NRBC: 0 % (ref 0.0–0.2)
Neutro Abs: 4.6 10*3/uL (ref 1.7–7.7)
Neutrophils Relative %: 60 %
Platelets: 180 10*3/uL (ref 150–400)
RBC: 5.03 MIL/uL (ref 3.87–5.11)
RDW: 13.2 % (ref 11.5–15.5)
WBC: 7.8 10*3/uL (ref 4.0–10.5)

## 2018-11-23 LAB — INFLUENZA PANEL BY PCR (TYPE A & B)
Influenza A By PCR: NEGATIVE
Influenza B By PCR: POSITIVE — AB

## 2018-11-23 LAB — BRAIN NATRIURETIC PEPTIDE: B Natriuretic Peptide: 25.7 pg/mL (ref 0.0–100.0)

## 2018-11-23 LAB — I-STAT TROPONIN, ED: TROPONIN I, POC: 0.02 ng/mL (ref 0.00–0.08)

## 2018-11-23 MED ORDER — SODIUM CHLORIDE 0.9 % IV SOLN
1.0000 g | Freq: Once | INTRAVENOUS | Status: AC
  Administered 2018-11-23: 1 g via INTRAVENOUS
  Filled 2018-11-23: qty 10

## 2018-11-23 MED ORDER — LISINOPRIL 10 MG PO TABS
10.0000 mg | ORAL_TABLET | Freq: Once | ORAL | Status: AC
  Administered 2018-11-23: 10 mg via ORAL
  Filled 2018-11-23: qty 1

## 2018-11-23 MED ORDER — DOXYCYCLINE HYCLATE 100 MG PO CAPS: 100.0000 mg | ORAL_CAPSULE | Freq: Two times a day (BID) | ORAL | 0 refills | Status: AC

## 2018-11-23 MED ORDER — HYDROCHLOROTHIAZIDE 12.5 MG PO CAPS
12.5000 mg | ORAL_CAPSULE | Freq: Every day | ORAL | Status: DC
  Administered 2018-11-23 (×2): 12.5 mg via ORAL
  Filled 2018-11-23 (×2): qty 1

## 2018-11-23 MED ORDER — ALBUTEROL SULFATE (2.5 MG/3ML) 0.083% IN NEBU
5.0000 mg | INHALATION_SOLUTION | Freq: Once | RESPIRATORY_TRACT | Status: AC
  Administered 2018-11-23: 5 mg via RESPIRATORY_TRACT
  Filled 2018-11-23: qty 6

## 2018-11-23 MED ORDER — AEROCHAMBER PLUS FLO-VU SMALL MISC
1.0000 | Freq: Once | Status: AC
  Administered 2018-11-23: 1
  Filled 2018-11-23: qty 1

## 2018-11-23 MED ORDER — AZITHROMYCIN 250 MG PO TABS
500.0000 mg | ORAL_TABLET | Freq: Once | ORAL | Status: AC
  Administered 2018-11-23: 500 mg via ORAL
  Filled 2018-11-23: qty 2

## 2018-11-23 MED ORDER — AMOXICILLIN 500 MG PO CAPS: 1000.0000 mg | ORAL_CAPSULE | Freq: Three times a day (TID) | ORAL | 0 refills | Status: AC

## 2018-11-23 MED ORDER — BENZONATATE 100 MG PO CAPS: 100.0000 mg | ORAL_CAPSULE | Freq: Three times a day (TID) | ORAL | 0 refills | Status: DC

## 2018-11-23 MED ORDER — IPRATROPIUM BROMIDE 0.02 % IN SOLN
0.5000 mg | Freq: Once | RESPIRATORY_TRACT | Status: AC
  Administered 2018-11-23: 0.5 mg via RESPIRATORY_TRACT
  Filled 2018-11-23: qty 2.5

## 2018-11-23 NOTE — ED Provider Notes (Signed)
[name]PART[facility] Provider Note   CSN: [id number] Arrival date & time: 11/23/18  0941     History   Chief Complaint Chief Complaint  Patient presents with  . Shortness of Breath    HPI Kathleen Stout is a 47 y.o. female with a history of diabetes mellitus type 2, anemia, HTN, and morbid obesity who presents to the emergency department with a chief complaint of shortness of breath.  The patient endorses constant shortness of breath, wheezing, and productive cough with yellow sputum for 1 month.  She reports the shortness of breath is worse when laying flat.  No alleviating factors.  She reports that she has been having to sleep in her recliner at home due to her symptoms.  She reports that 3 weeks ago she was seen via a telemedicine visit and was prescribed a 5-day course of prednisone and an albuterol inhaler.  She reports no improvement in her symptoms with the prednisone course.  She states she is continue to use her home inhaler 2 times daily with minimal improvement.  She reports that last night her shortness of breath significantly worsened she began to have intermittent sharp, nonradiating central chest pain.  States the chest pain is associated with wheezing.  She reports she is also been having chills, body aches, and a headache over the last few days.  She has had 2 episodes of nonbloody, nonbilious emesis since yesterday.  She did not receive her flu shot this year.  She denies visual changes, palpitations, leg swelling, diarrhea, abdominal pain, back pain, urinary symptoms.  She has not taken her home medications this morning.  She is a never smoker.  Family history includes cardiovascular disease in her mother.  The history is provided by the patient. No language interpreter was used.    Past Medical History:  Diagnosis Date  . Anemia   . Arthritis   . Chest pressure    a. 08/2016 Myoview: EF 68%, no ST changes, basal anterior/mid  anterior defect - felt to be attenuation-->Low risk.  . Essential hypertension   . Morbid obesity ([place])   . Type II diabetes mellitus [facility])     Patient Active Problem List   Diagnosis Date Noted  . Depression, recurrent ([place]) 05/29/2018  . Chest pressure   . Chest pain 09/19/2016  . Morbid obesity ([place])   . Type II diabetes mellitus ([place])   . Morbid obesity with BMI of 50.0-59.9, adult ([place]) 08/02/2015  . Diabetes mellitus type 2 in obese ([place]) 08/02/2015  . Essential hypertension 05/21/2015  . Osteoarthritis of left knee 05/21/2015  . Knee pain, left 04/20/2015    Past Surgical History:  Procedure Laterality Date  . ABDOMINAL HYSTERECTOMY       OB History   No obstetric history on file.      Home Medications    Prior to Admission medications   Medication Sig Start Date End Date Taking? Authorizing Provider  albuterol (PROVENTIL HFA;VENTOLIN HFA) 108 (90 Base) MCG/ACT inhaler Inhale 1-2 puffs into the lungs every 6 (six) hours as needed for wheezing or shortness of breath.   Yes [provider]  lisinopril-hydrochlorothiazide (PRINZIDE,ZESTORETIC) 10-12.5 MG tablet Take 1 tablet by mouth daily. 06/12/18  Yes Henson, Vickie L, NP-C  metFORMIN (GLUCOPHAGE) 1000 MG tablet Take 1 tablet (1,000 mg total) by mouth 2 (two) times daily with a meal. 05/29/18  Yes Henson, Vickie L, NP-C  simvastatin (ZOCOR) 10 MG tablet Take 1 tablet (10 mg total)  by mouth at bedtime. 06/12/18  Yes Henson, Vickie L, NP-C  amoxicillin (AMOXIL) 500 MG capsule Take 2 capsules (1,000 mg total) by mouth 3 (three) times daily for 5 days. 11/23/18 11/28/18  McDonald, Mia A, PA-C  benzonatate (TESSALON) 100 MG capsule Take 1 capsule (100 mg total) by mouth every 8 (eight) hours. 11/23/18   McDonald, Mia A, PA-C  Blood Glucose Monitoring Suppl (TRUE METRIX METER) DEVI 1 kit by Does not apply route as directed. 07/30/15   Lance Bosch, NP  doxycycline (VIBRAMYCIN) 100 MG capsule Take 1 capsule (100 mg  total) by mouth 2 (two) times daily for 5 days. 11/23/18 11/28/18  McDonald, Mia A, PA-C  Dulaglutide (TRULICITY) 7.82 NF/6.2ZH SOPN Inject 0.'75mg'$  weekly 05/29/18   Harland Dingwall L, NP-C  glucose blood (TRUE METRIX BLOOD GLUCOSE TEST) test strip Use as instructed 07/30/15   Lance Bosch, NP  TRUEPLUS LANCETS 28G MISC Use as directed 07/30/15   Lance Bosch, NP    Family History Family History  Problem Relation Age of Onset  . Diabetes Mother   . Hypertension Mother   . Hyperlipidemia Mother   . Heart disease Mother   . Aneurysm Mother 14  . Healthy Sister   . Healthy Brother   . Healthy Sister   . Healthy Sister   . Healthy Sister   . Diabetes Brother   . Hypertension Brother   . Healthy Brother   . Healthy Brother   . Healthy Brother   . Healthy Brother   . Healthy Brother   . Healthy Brother   . Healthy Brother   . Healthy Brother     Social History Social History   Tobacco Use  . Smoking status: Never Smoker  . Smokeless tobacco: Never Used  Substance Use Topics  . Alcohol use: No  . Drug use: No     Allergies   Patient has no known allergies.   Review of Systems Review of Systems  Constitutional: Positive for chills. Negative for activity change and fever.  HENT: Negative for congestion.   Eyes: Negative for visual disturbance.  Respiratory: Positive for cough, shortness of breath and wheezing.   Cardiovascular: Positive for chest pain. Negative for palpitations and leg swelling.  Gastrointestinal: Positive for nausea and vomiting. Negative for abdominal pain and diarrhea.  Genitourinary: Negative for dysuria.  Musculoskeletal: Positive for myalgias. Negative for back pain.  Skin: Negative for rash.  Allergic/Immunologic: Negative for immunocompromised state.  Neurological: Positive for headaches. Negative for dizziness, syncope, weakness and numbness.  Psychiatric/Behavioral: Negative for confusion.   Physical Exam Updated Vital Signs BP (!) 169/98  (BP Location: Right Wrist)   Pulse (!) 103   Temp 98.4 F (36.9 C) (Oral)   Resp 18   Ht '5\' 4"'$  (1.626 m)   Wt (!) 158.8 kg   SpO2 98%   BMI 60.08 kg/m   Physical Exam Vitals signs and nursing note reviewed.  Constitutional:      General: She is not in acute distress.    Comments: Morbidly obese  HENT:     Head: Normocephalic.  Eyes:     Conjunctiva/sclera: Conjunctivae normal.  Neck:     Musculoskeletal: Neck supple.  Cardiovascular:     Rate and Rhythm: Normal rate and regular rhythm.     Heart sounds: No murmur. No friction rub. No gallop.      Comments: Radial and DP pulses are 2+ and symmetric. Pulmonary:     Effort: Pulmonary effort  is normal. No respiratory distress.     Breath sounds: Wheezing present.     Comments: Diffuse inspiratory and expiratory wheezes throughout. Chest:     Chest wall: No tenderness.  Abdominal:     General: There is no distension.     Palpations: Abdomen is soft.  Musculoskeletal:     Right lower leg: She exhibits no tenderness. No edema.     Left lower leg: She exhibits no tenderness. No edema.  Skin:    General: Skin is warm.     Findings: No rash.  Neurological:     Mental Status: She is alert.  Psychiatric:        Behavior: Behavior normal.    ED Treatments / Results  Labs (all labs ordered are listed, but only abnormal results are displayed) Labs Reviewed  INFLUENZA PANEL BY PCR (TYPE A & B) - Abnormal; Notable for the following components:      Result Value   Influenza B By PCR POSITIVE (*)    All other components within normal limits  COMPREHENSIVE METABOLIC PANEL - Abnormal; Notable for the following components:   Glucose, Bld 150 (*)    Calcium 8.4 (*)    Albumin 3.3 (*)    All other components within normal limits  CBC WITH DIFFERENTIAL/PLATELET  BRAIN NATRIURETIC PEPTIDE  I-STAT TROPONIN, ED    EKG EKG Interpretation  Date/Time:  Saturday November 23 2018 10:01:10 EST Ventricular Rate:  89 PR Interval:      QRS Duration: 86 QT Interval:  358 QTC Calculation: 436 R Axis:   81 Text Interpretation:  Sinus rhythm Probable left atrial enlargement Anterior infarct, old No STEMI.  Confirmed by Nanda Quinton [phone]) on 11/23/2018 10:08:02 AM   Radiology Dg Chest 2 View  Result Date: 11/23/2018 CLINICAL DATA:  Patient with cough and shortness of breath for 1 month. EXAM: CHEST - 2 VIEW COMPARISON:  Chest radiograph 09/06/2016 FINDINGS: Monitoring leads overlie the patient. Stable cardiomegaly. Low lung volumes. Retrocardiac consolidation. No pleural effusion or pneumothorax. IMPRESSION: Retrocardiac consolidation may represent pneumonia in the appropriate clinical setting. Followup PA and lateral chest X-ray is recommended in 3-4 weeks following trial of antibiotic therapy to ensure resolution and exclude underlying malignancy. Electronically Signed   By: Lovey Newcomer M.D.   On: 11/23/2018 11:15    Procedures Procedures (including critical care time)  Medications Ordered in ED Medications  hydrochlorothiazide (MICROZIDE) capsule 12.5 mg (12.5 mg Oral Given 11/23/18 1133)  albuterol (PROVENTIL) (2.5 MG/3ML) 0.083% nebulizer solution 5 mg (5 mg Nebulization Given 11/23/18 1004)  ipratropium (ATROVENT) nebulizer solution 0.5 mg (0.5 mg Nebulization Given 11/23/18 1051)  lisinopril (PRINIVIL,ZESTRIL) tablet 10 mg (10 mg Oral Given 11/23/18 1122)  cefTRIAXone (ROCEPHIN) 1 g in sodium chloride 0.9 % 100 mL IVPB (0 g Intravenous Stopped 11/23/18 1231)  azithromycin (ZITHROMAX) tablet 500 mg (500 mg Oral Given 11/23/18 1133)  albuterol (PROVENTIL) (2.5 MG/3ML) 0.083% nebulizer solution 5 mg (5 mg Nebulization Given 11/23/18 1231)  AEROCHAMBER PLUS FLO-VU SMALL device MISC 1 each (1 each Other Provided for home use 11/23/18 1509)     Initial Impression / Assessment and Plan / ED Course  I have reviewed the triage vital signs and the nursing notes.  Pertinent labs & imaging results that were available  during my care of the patient were reviewed by me and considered in my medical decision making (see chart for details).     47 year old female with a history of diabetes mellitus type 2, anemia,  HTN, and morbid obesity presenting with shortness of breath, productive cough with yellow sputum, and wheezing for the last month with flulike symptoms for the last few days.  Afebrile in the ED.  No tachycardia or hypoxia.  She is markedly hypertensive in the ER.  She has not taken her home blood pressure medications this AM.   EKG with sinus rhythm and probable left atrial enlargement.  Chest x-ray with retrocardiac opacity concerning for pneumonia, but repeat x-ray is recommended in 3 to 4 weeks.  Initial troponin is negative.  Labs are otherwise reassuring.  Albuterol and ipratropium nebulizer x2 given in the ED.  Will initiate IV Rocephin and oral azithromycin for CAP while monitoring BP.  Influenza panel is positive for influenza B.  She was ambulated in the ED and sats were maintained from 93 to 98% with good waveform.  She did get tachycardic in the 120s and blood pressure on recheck was in the 728V systolically.  Tachycardia could be secondary to patient's body habitus versus recent nebulizer treatments or combination of both.  The patient, her significant other, and I had a long shared decision making conversation.  The patient was offered admission given her poorly controlled hypertension in the setting of community-acquired pneumonia along with influenza.  She states that she is feeling much better after nebulizer treatments.  I have offered her a spacer for her home inhaler, which she is agreeable to.  At this time, she declines admission.  States that she will call her PCPs office as soon as they reopen for recheck.  I encouraged her to maintain good compliance with her home antihypertensive medications.  I will discharge her home on amoxicillin and doxycycline per IDSA guidelines.  She has a full  albuterol inhaler and does not need a refill at this time.  She was given very strict return precautions to the ED if her symptoms worsen given her work-up today.  She is hemodynamically stable and in no acute distress.  She is safe for discharge home with outpatient follow-up at this time.  Final Clinical Impressions(s) / ED Diagnoses   Final diagnoses:  Community acquired pneumonia of left lower lobe of lung [facility])    ED Discharge Orders         Ordered    amoxicillin (AMOXIL) 500 MG capsule  3 times daily     11/23/18 1437    doxycycline (VIBRAMYCIN) 100 MG capsule  2 times daily     11/23/18 1437    benzonatate (TESSALON) 100 MG capsule  Every 8 hours     11/23/18 1437           McDonald, Maree Erie A, PA-C 11/23/18 1656    Margette Fast, MD 11/23/18 2029

## 2018-11-23 NOTE — ED Triage Notes (Signed)
Pt endorse cough with yellow mucous and shob x 1 month. Pt took a round of prednisone and prescribed an inhaler without relief. Denies birth control or long trips. Hypertensive in triage. Axox4.

## 2018-11-23 NOTE — ED Notes (Signed)
Patient transported to X-ray 

## 2018-11-23 NOTE — ED Notes (Signed)
.   Sat sitting 98% RA, Walked patient around nursing dept Sats 93 to 98% on RA with HR of 120. [facility]

## 2018-11-23 NOTE — Discharge Instructions (Addendum)
Thank you for allowing me to care for you today in the Emergency Department.   Your chest x-ray was consistent with pneumonia today.  To treat this, take 2 tablets of amoxicillin every 8 hours for the next 5 days and 1 tablet of doxycycline 2 times daily for the next 5 days.  For cough, you can take 1 tablet of benzonatate every 8 hours as needed.  For body aches and headache, you can take 650 mg of Tylenol or 600 mg of ibuprofen with food every 6 hours as needed.  Call your primary care provider first thing Monday morning to schedule a follow-up visit.  Please have them recheck your blood pressure as it was significantly elevated today.  You will likely need a repeat chest x-ray in the next few weeks.  Use 2 puffs of your albuterol inhaler with a spacer every 4 hours as needed for shortness of breath, wheezing, or coughing.  Return to the emergency department if you develop severe shortness of breath, respiratory distress, vomiting, severe chest pain, or other new, concerning symptoms.

## 2018-11-25 ENCOUNTER — Ambulatory Visit (INDEPENDENT_AMBULATORY_CARE_PROVIDER_SITE_OTHER): Payer: Managed Care, Other (non HMO) | Admitting: Medical

## 2018-11-25 ENCOUNTER — Encounter: Payer: Self-pay | Admitting: Medical

## 2018-11-25 VITALS — BP 160/100 | HR 82 | Temp 98.2°F | Resp 16 | Wt 321.4 lb

## 2018-11-25 DIAGNOSIS — E119 Type 2 diabetes mellitus without complications: Secondary | ICD-10-CM | POA: Diagnosis not present

## 2018-11-25 DIAGNOSIS — J189 Pneumonia, unspecified organism: Secondary | ICD-10-CM | POA: Diagnosis not present

## 2018-11-25 DIAGNOSIS — J101 Influenza due to other identified influenza virus with other respiratory manifestations: Secondary | ICD-10-CM | POA: Diagnosis not present

## 2018-11-25 DIAGNOSIS — I10 Essential (primary) hypertension: Secondary | ICD-10-CM

## 2018-11-25 DIAGNOSIS — R062 Wheezing: Secondary | ICD-10-CM

## 2018-11-25 NOTE — Progress Notes (Signed)
Subjective:  Kathleen Stout is a 47 y.o. female who presents for Chief Complaint  Patient presents with  . follow-up    ER follow-up. pneumonia and flu     Here today for emergency department follow-up.  She was seen 2 days ago in the emergency department for influenza and pneumonia, had been put on prednisone.  That wasn't helping, so she ended up going to the ED.   She is not currently checking sugars although she has glucometer and testing supplies.   She is taking the Amoxicillin and Doxycycline, using the inhaler TID   Is trying to hydrate well.   Is suppose to report back to work on 11/28/17.  No other aggravating or relieving factors.    No other c/o.  The following portions of the patient's history were reviewed and updated as appropriate: allergies, current medications, past family history, past medical history, past social history, past surgical history and problem list.  ROS Otherwise as in subjective above  Past Medical History:  Diagnosis Date  . Anemia   . Arthritis   . Chest pressure    a. 08/2016 Myoview: EF 68%, no ST changes, basal anterior/mid anterior defect - felt to be attenuation-->Low risk.  . Essential hypertension   . Morbid obesity (HCC)   . Type II diabetes mellitus (HCC)     Objective: BP (!) 160/100   Pulse 82   Temp 98.2 F (36.8 C) (Oral)   Resp 16   Wt (!) 321 lb 6.4 oz (145.8 kg)   SpO2 99%   BMI 55.17 kg/m   General appearance: alert, no distress, well developed, well nourished, coughing HEENT: normocephalic, sclerae anicteric, conjunctiva pink and moist, TMs pearly, nares patent, no discharge or erythema, pharynx normal Oral cavity: MMM, no lesions Neck: supple, no lymphadenopathy, no thyromegaly, no masses Heart: RRR, normal S1, S2, no murmurs Lungs: somewhat decreased breath sounds, otherwise no wheezes, rhonchi, or rales Pulses: 2+ radial pulses, 2+ pedal pulses, normal cap refill Ext: no edema   Assessment: Encounter Diagnoses   Name Primary?  . Pneumonia due to infectious organism, unspecified laterality, unspecified part of lung Yes  . Influenza B   . Type 2 diabetes mellitus without complication, without long-term current use of insulin (HCC)   . Wheezing   . Essential hypertension      Plan: I reviewed her recent emergency department notes, chest x-ray, discussed her current symptoms and regimen.  She seems somewhat improved today compared to the last few days.  We discussed the following recommendations, the importance of rest and hydration, giving this time to improve over the next week.  We discussed return to work later in the week if feeling much improved.  We also discussed symptoms and complications that would prompt an urgent recheck.  We discussed proper use of albuterol.  If she has a bad night tonight of wheezing or shortness of breath then consider nebulizer treatment at home  F/u 2 weeks to recheck on BP, glucose readings.  C/t rest of medications as usual  Patient Instructions  Recommendations  REST!  Hydrate well with water throughout the day  You can use the albuterol inhaler, 2 puffs every 4-6 hours throughout the day as needed for cough, wheezing, shortness of breath  Continue the 2 antibiotics given by the emergency department  If you are not improving or getting worse by the day then call back or return.  It is important not to get dehydrated  Your symptoms should gradually improve  over the course of the week  If you have a really bad night with a lot of wheezing or shortness of breath tonight then call back tomorrow   [name] was seen today for follow-up.  Diagnoses and all orders for this visit:  Pneumonia due to infectious organism, unspecified laterality, unspecified part of lung -     DG Chest 2 View; Future -     Pulse oximetry (single); Future  Influenza B -     DG Chest 2 View; Future -     Pulse oximetry (single); Future  Type 2 diabetes mellitus without  complication, without long-term current use of insulin (HCC)  Wheezing  Essential hypertension    Follow up: 2 weeks

## 2018-11-25 NOTE — Patient Instructions (Signed)
Recommendations  REST!  Hydrate well with water throughout the day  You can use the albuterol inhaler, 2 puffs every 4-6 hours throughout the day as needed for cough, wheezing, shortness of breath  Continue the 2 antibiotics given by the emergency department  If you are not improving or getting worse by the day then call back or return.  It is important not to get dehydrated  Your symptoms should gradually improve over the course of the week  If you have a really bad night with a lot of wheezing or shortness of breath tonight then call back tomorrow

## 2018-12-09 ENCOUNTER — Ambulatory Visit: Payer: Managed Care, Other (non HMO) | Admitting: Medical

## 2019-01-24 ENCOUNTER — Inpatient Hospital Stay
Admission: RE | Admit: 2019-01-24 | Discharge: 2019-01-24 | Disposition: A | Discharge status: Home / Self Care | Payer: Self-pay | Source: Ambulatory Visit | Attending: Family Medicine | Admitting: Family Medicine

## 2019-06-02 ENCOUNTER — Encounter: Payer: Managed Care, Other (non HMO) | Admitting: Family Medicine

## 2020-01-26 ENCOUNTER — Ambulatory Visit: Payer: BLUE CROSS/BLUE SHIELD | Attending: Internal Medicine

## 2020-01-26 DIAGNOSIS — Z23 Encounter for immunization: Secondary | ICD-10-CM | POA: Insufficient documentation

## 2020-01-26 NOTE — Progress Notes (Signed)
   Covid-19 Vaccination Clinic  Name:  Aslee Such    MRN: [id number] DOB: 1971-05-28  01/26/2020  Ms. Vanderweele was observed post Covid-19 immunization for 15 minutes without incidence. She was provided with Vaccine Information Sheet and instruction to access the V-Safe system.   Ms. Westendorf was instructed to call 911 with any severe reactions post vaccine: Marland Kitchen Difficulty breathing  . Swelling of your face and throat  . A fast heartbeat  . A bad rash all over your body  . Dizziness and weakness    Immunizations Administered    Name Date Dose VIS Date Route   Pfizer COVID-19 Vaccine 01/26/2020 12:41 PM 0.3 mL 11/07/2019 Intramuscular   Manufacturer: [employer], [employer]   Lot: [id number]   NDC: [address]-3

## 2020-02-18 ENCOUNTER — Ambulatory Visit: Payer: BLUE CROSS/BLUE SHIELD | Attending: Internal Medicine

## 2020-02-18 DIAGNOSIS — Z23 Encounter for immunization: Secondary | ICD-10-CM

## 2020-02-18 NOTE — Progress Notes (Signed)
   Covid-19 Vaccination Clinic  Name:  Kathleen Stout    MRN: [id number] DOB: 1971/01/04  02/18/2020  [name] was observed post Covid-19 immunization for 15 minutes without incident. She was provided with Vaccine Information Sheet and instruction to access the V-Safe system.   [name] was instructed to call 911 with any severe reactions post vaccine: Marland Kitchen Difficulty breathing  . Swelling of face and throat  . A fast heartbeat  . A bad rash all over body  . Dizziness and weakness   Immunizations Administered    Name Date Dose VIS Date Route   Pfizer COVID-19 Vaccine 02/18/2020 12:10 PM 0.3 mL 11/07/2019 Intramuscular   Manufacturer: [employer], [employer]   Lot: [id number]   NDC: [address]-2

## 2020-04-23 ENCOUNTER — Other Ambulatory Visit: Payer: Self-pay

## 2020-04-23 ENCOUNTER — Emergency Department (HOSPITAL_COMMUNITY): Payer: 59

## 2020-04-23 ENCOUNTER — Emergency Department (HOSPITAL_COMMUNITY)
Admission: EM | Admit: 2020-04-23 | Discharge: 2020-04-24 | Disposition: A | Discharge status: Home / Self Care | Payer: 59 | Attending: Emergency Medicine | Admitting: Emergency Medicine

## 2020-04-23 ENCOUNTER — Encounter (HOSPITAL_COMMUNITY): Payer: Self-pay | Admitting: *Deleted

## 2020-04-23 DIAGNOSIS — R0602 Shortness of breath: Secondary | ICD-10-CM | POA: Diagnosis present

## 2020-04-23 DIAGNOSIS — J189 Pneumonia, unspecified organism: Secondary | ICD-10-CM | POA: Diagnosis not present

## 2020-04-23 DIAGNOSIS — E119 Type 2 diabetes mellitus without complications: Secondary | ICD-10-CM | POA: Diagnosis not present

## 2020-04-23 DIAGNOSIS — I10 Essential (primary) hypertension: Secondary | ICD-10-CM | POA: Insufficient documentation

## 2020-04-23 DIAGNOSIS — Z79899 Other long term (current) drug therapy: Secondary | ICD-10-CM | POA: Insufficient documentation

## 2020-04-23 DIAGNOSIS — Z7984 Long term (current) use of oral hypoglycemic drugs: Secondary | ICD-10-CM | POA: Diagnosis not present

## 2020-04-23 LAB — URINALYSIS, ROUTINE W REFLEX MICROSCOPIC
Bilirubin Urine: NEGATIVE
Glucose, UA: >=500 mg/dL — AB
Hgb urine dipstick: NEGATIVE
Ketones, ur: NEGATIVE mg/dL
Nitrite: NEGATIVE
Protein, ur: NEGATIVE mg/dL
Specific Gravity, Urine: 1.025 (ref 1.005–1.030)
pH: 5 (ref 5.0–8.0)

## 2020-04-23 LAB — CBC WITH DIFFERENTIAL/PLATELET
Abs Immature Granulocytes: 0.02 10*3/uL (ref 0.00–0.07)
Basophils Absolute: 0.1 10*3/uL (ref 0.0–0.1)
Basophils Relative: 1 %
Eosinophils Absolute: 0.2 10*3/uL (ref 0.0–0.5)
Eosinophils Relative: 2 %
HCT: 45 % (ref 36.0–46.0)
Hemoglobin: 15.3 g/dL — ABNORMAL HIGH (ref 12.0–15.0)
Immature Granulocytes: 0 %
Lymphocytes Relative: 37 %
Lymphs Abs: 3 10*3/uL (ref 0.7–4.0)
MCH: 30.1 pg (ref 26.0–34.0)
MCHC: 34 g/dL (ref 30.0–36.0)
MCV: 88.4 fL (ref 80.0–100.0)
Monocytes Absolute: 0.7 10*3/uL (ref 0.1–1.0)
Monocytes Relative: 9 %
Neutro Abs: 4.1 10*3/uL (ref 1.7–7.7)
Neutrophils Relative %: 51 %
Platelets: 187 10*3/uL (ref 150–400)
RBC: 5.09 MIL/uL (ref 3.87–5.11)
RDW: 12.5 % (ref 11.5–15.5)
WBC: 8.1 10*3/uL (ref 4.0–10.5)
nRBC: 0 % (ref 0.0–0.2)

## 2020-04-23 LAB — BASIC METABOLIC PANEL
Anion gap: 9 (ref 5–15)
BUN: 12 mg/dL (ref 6–20)
CO2: 27 mmol/L (ref 22–32)
Calcium: 8.9 mg/dL (ref 8.9–10.3)
Chloride: 99 mmol/L (ref 98–111)
Creatinine, Ser: 0.8 mg/dL (ref 0.44–1.00)
GFR calc Af Amer: >60 mL/min (ref >60)
GFR calc non Af Amer: >60 mL/min (ref >60)
Glucose, Bld: 291 mg/dL — ABNORMAL HIGH (ref 70–99)
Potassium: 3.5 mmol/L (ref 3.5–5.1)
Sodium: 135 mmol/L (ref 135–145)

## 2020-04-23 LAB — BRAIN NATRIURETIC PEPTIDE: B Natriuretic Peptide: 17.1 pg/mL (ref 0.0–100.0)

## 2020-04-23 MED ORDER — AMOXICILLIN-POT CLAVULANATE 875-125 MG PO TABS: 1.0000 | ORAL_TABLET | Freq: Two times a day (BID) | ORAL | 0 refills | Status: DC

## 2020-04-23 MED ORDER — HYDRALAZINE HCL 20 MG/ML IJ SOLN
10.0000 mg | Freq: Once | INTRAMUSCULAR | Status: AC
  Administered 2020-04-23: 10 mg via INTRAVENOUS
  Filled 2020-04-23: qty 1

## 2020-04-23 MED ORDER — AMOXICILLIN-POT CLAVULANATE 875-125 MG PO TABS
1.0000 | ORAL_TABLET | Freq: Once | ORAL | Status: AC
  Administered 2020-04-24: 1 via ORAL
  Filled 2020-04-23: qty 1

## 2020-04-23 NOTE — Discharge Instructions (Addendum)
As discussed, your evaluation today has been largely reassuring.  But, it is important that you monitor your condition carefully, and do not hesitate to return to the ED if you develop new, or concerning changes in your condition.  In addition to your previously prescribed antibiotic is important you take the newly prescribed as well.  Otherwise, please follow-up with your physician for appropriate ongoing care.

## 2020-04-23 NOTE — ED Triage Notes (Signed)
Pt states SHOB started Sat. She went to Urgent Care on Wed. Dx with Pneumonia, given meds, symptoms are not improving. HTN in triage, dyspnea with wheezing noted

## 2020-04-23 NOTE — ED Provider Notes (Signed)
[place] DEPT Provider Note   CSN: [id number] Arrival date & time: 04/23/20  1447     History Chief Complaint  Patient presents with  . Shortness of Breath    Kathleen Stout is a 49 y.o. female.  HPI    Patient with history of hypertension, diabetes now presents with concern of ongoing cough, dyspnea. Patient's illness began almost 1 week ago.  Since that time she has had worsening cough, dyspnea, fatigue. She was seen and evaluated at urgent care 2 days ago, diagnosed with pneumonia via x-ray. She was started on antibiotics, and has been taking these for 3 days. She also was tested for coronavirus, and reportedly has had 2 - results.  She received her Covid vaccines earlier this year as well. She denies recent fever, vomiting, diarrhea, specific pain, but with worsening cough, fatigue, dyspnea in spite of using albuterol, antibiotics she presents for evaluation. Past Medical History:  Diagnosis Date  . Anemia   . Arthritis   . Chest pressure    a. 08/2016 Myoview: EF 68%, no ST changes, basal anterior/mid anterior defect - felt to be attenuation-->Low risk.  . Essential hypertension   . Morbid obesity ([place])   . Type II diabetes mellitus [facility])     Patient Active Problem List   Diagnosis Date Noted  . Wheezing 11/25/2018  . Influenza B 11/25/2018  . Pneumonia due to infectious organism 11/25/2018  . Depression, recurrent ([place]) 05/29/2018  . Chest pressure   . Chest pain 09/19/2016  . Morbid obesity ([place])   . Type II diabetes mellitus ([place])   . Morbid obesity with BMI of 50.0-59.9, adult ([place]) 08/02/2015  . Diabetes mellitus type 2 in obese ([place]) 08/02/2015  . Essential hypertension 05/21/2015  . Osteoarthritis of left knee 05/21/2015  . Knee pain, left 04/20/2015    Past Surgical History:  Procedure Laterality Date  . ABDOMINAL HYSTERECTOMY       OB History   No obstetric history on file.     Family History  Problem  Relation Age of Onset  . Diabetes Mother   . Hypertension Mother   . Hyperlipidemia Mother   . Heart disease Mother   . Aneurysm Mother 24  . Healthy Sister   . Healthy Brother   . Healthy Sister   . Healthy Sister   . Healthy Sister   . Diabetes Brother   . Hypertension Brother   . Healthy Brother   . Healthy Brother   . Healthy Brother   . Healthy Brother   . Healthy Brother   . Healthy Brother   . Healthy Brother   . Healthy Brother     Social History   Tobacco Use  . Smoking status: Never Smoker  . Smokeless tobacco: Never Used  Substance Use Topics  . Alcohol use: No  . Drug use: No    Home Medications Prior to Admission medications   Medication Sig Start Date End Date Taking? Authorizing Provider  albuterol (PROVENTIL HFA;VENTOLIN HFA) 108 (90 Base) MCG/ACT inhaler Inhale 1-2 puffs into the lungs every 6 (six) hours as needed for wheezing or shortness of breath.    [provider]  benzonatate (TESSALON) 100 MG capsule Take 1 capsule (100 mg total) by mouth every 8 (eight) hours. 11/23/18   McDonald, Mia A, PA-C  Blood Glucose Monitoring Suppl (TRUE METRIX METER) DEVI 1 kit by Does not apply route as directed. 07/30/15   Lance Bosch, NP  Dulaglutide (TRULICITY) 2.70  MG/0.5ML SOPN Inject 0.77m weekly Patient not taking: Reported on 11/25/2018 05/29/18   HHarland DingwallL, NP-C  glucose blood (TRUE METRIX BLOOD GLUCOSE TEST) test strip Use as instructed 07/30/15   KLance Bosch NP  lisinopril-hydrochlorothiazide (PRINZIDE,ZESTORETIC) 10-12.5 MG tablet Take 1 tablet by mouth daily. 06/12/18   Henson, Vickie L, NP-C  metFORMIN (GLUCOPHAGE) 1000 MG tablet Take 1 tablet (1,000 mg total) by mouth 2 (two) times daily with a meal. 05/29/18   Henson, Vickie L, NP-C  simvastatin (ZOCOR) 10 MG tablet Take 1 tablet (10 mg total) by mouth at bedtime. 06/12/18   HGirtha Rm NP-C  TRUEPLUS LANCETS 28G MISC Use as directed 07/30/15   KLance Bosch NP    Allergies     Patient has no known allergies.  Review of Systems   Review of Systems  Constitutional:       Per HPI, otherwise negative  HENT:       Per HPI, otherwise negative  Respiratory:       Per HPI, otherwise negative  Cardiovascular:       Per HPI, otherwise negative  Gastrointestinal: Negative for vomiting.  Endocrine:       Negative aside from HPI  Genitourinary:       Neg aside from HPI   Musculoskeletal:       Per HPI, otherwise negative  Skin: Negative.   Neurological: Positive for weakness. Negative for syncope.    Physical Exam Updated Vital Signs BP (!) 190/114   Pulse 77   Temp 98.2 F (36.8 C) (Oral)   Resp 20   Ht 5' 4" (1.626 m)   Wt (!) 145.2 kg   SpO2 95%   BMI 54.93 kg/m   Physical Exam Vitals and nursing note reviewed.  Constitutional:      General: She is not in acute distress.    Appearance: She is well-developed.  HENT:     Head: Normocephalic and atraumatic.  Eyes:     Conjunctiva/sclera: Conjunctivae normal.  Cardiovascular:     Rate and Rhythm: Regular rhythm. Tachycardia present.  Pulmonary:     Effort: Pulmonary effort is normal. Tachypnea present.     Breath sounds: Examination of the left-upper field reveals wheezing. Wheezing present.  Abdominal:     General: There is no distension.  Skin:    General: Skin is warm and dry.  Neurological:     Mental Status: She is alert and oriented to person, place, and time.     Cranial Nerves: No cranial nerve deficit.     ED Results / Procedures / Treatments   Labs (all labs ordered are listed, but only abnormal results are displayed) Labs Reviewed  CBC WITH DIFFERENTIAL/PLATELET - Abnormal; Notable for the following components:      Result Value   Hemoglobin 15.3 (*)    All other components within normal limits  BASIC METABOLIC PANEL - Abnormal; Notable for the following components:   Glucose, Bld 291 (*)    All other components within normal limits  URINALYSIS, ROUTINE W REFLEX  MICROSCOPIC - Abnormal; Notable for the following components:   Glucose, UA >=500 (*)    Leukocytes,Ua MODERATE (*)    Bacteria, UA RARE (*)    All other components within normal limits  BRAIN NATRIURETIC PEPTIDE    EKG None  Radiology DG Chest 2 View  Result Date: 04/23/2020 CLINICAL DATA:  Shortness of breath EXAM: CHEST - 2 VIEW COMPARISON:  11/23/2018 FINDINGS: Cardiac shadow is mildly  prominent but stable. Elevation of the right hemidiaphragm is noted and stable. Some increased density is noted posteriorly on the lateral projection likely related to right lower lobe infiltrate. No bony abnormality is noted. IMPRESSION: Increased density suggestive of right lower lobe infiltrate. Electronically Signed   By: Inez Catalina M.D.   On: 04/23/2020 16:49    Procedures Procedures (including critical care time)  Medications Ordered in ED Medications  hydrALAZINE (APRESOLINE) injection 10 mg (10 mg Intravenous Given 04/23/20 2235)    ED Course  I have reviewed the triage vital signs and the nursing notes.  Pertinent labs & imaging results that were available during my care of the patient were reviewed by me and considered in my medical decision making (see chart for details).  11:25 PM Now on repeat exam the patient is calm, slightly hypertensive, but no hypoxia, no increased work of breathing.  I we discussed tonight's findings.  There is demonstration of right lower lobe pneumonia, but labs are otherwise reassuring. BNP is normal, no hepatic or renal dysfunction no leukocytosis. Patient does have mild glucosuria, leukocytes, but no nitrates and no urinary symptoms. All these findings are reassuring, no evidence for sepsis, bacteremia or organ dysfunction secondary to her pneumonia. Patient now able to recall that she has been on azithromycin, seemingly only this medication for pneumonia, will require broadening of her coverage.  Patient amenable to, appropriate for discharge with  outpatient follow-up. Final Clinical Impression(s) / ED Diagnoses Final diagnoses:  Community acquired pneumonia of right lower lobe of lung    Rx / DC Orders ED Discharge Orders         Ordered    amoxicillin-clavulanate (AUGMENTIN) 875-125 MG tablet  Every 12 hours     04/23/20 2326           Carmin Muskrat, MD 04/23/20 2327

## 2021-01-31 ENCOUNTER — Ambulatory Visit: Payer: 59 | Admitting: Nurse Practitioner

## 2021-04-12 ENCOUNTER — Ambulatory Visit: Payer: 59 | Attending: Nurse Practitioner | Admitting: Nurse Practitioner

## 2021-04-12 ENCOUNTER — Encounter: Payer: Self-pay | Admitting: Nurse Practitioner

## 2021-04-12 ENCOUNTER — Other Ambulatory Visit: Payer: Self-pay

## 2021-04-12 VITALS — BP 165/112 | HR 82 | Resp 20 | Ht 63.5 in | Wt 286.0 lb

## 2021-04-12 DIAGNOSIS — E113593 Type 2 diabetes mellitus with proliferative diabetic retinopathy without macular edema, bilateral: Secondary | ICD-10-CM

## 2021-04-12 DIAGNOSIS — Z6841 Body Mass Index (BMI) 40.0 and over, adult: Secondary | ICD-10-CM

## 2021-04-12 DIAGNOSIS — D649 Anemia, unspecified: Secondary | ICD-10-CM

## 2021-04-12 DIAGNOSIS — Z1159 Encounter for screening for other viral diseases: Secondary | ICD-10-CM

## 2021-04-12 DIAGNOSIS — E1165 Type 2 diabetes mellitus with hyperglycemia: Secondary | ICD-10-CM | POA: Diagnosis not present

## 2021-04-12 DIAGNOSIS — Z1231 Encounter for screening mammogram for malignant neoplasm of breast: Secondary | ICD-10-CM

## 2021-04-12 DIAGNOSIS — Z1211 Encounter for screening for malignant neoplasm of colon: Secondary | ICD-10-CM

## 2021-04-12 DIAGNOSIS — E785 Hyperlipidemia, unspecified: Secondary | ICD-10-CM | POA: Diagnosis not present

## 2021-04-12 DIAGNOSIS — I10 Essential (primary) hypertension: Secondary | ICD-10-CM

## 2021-04-12 LAB — POCT GLYCOSYLATED HEMOGLOBIN (HGB A1C): HbA1c, POC (controlled diabetic range): 13.4 % — AB (ref 0.0–7.0)

## 2021-04-12 LAB — GLUCOSE, POCT (MANUAL RESULT ENTRY): POC Glucose: 403 mg/dl — AB (ref 70–99)

## 2021-04-12 MED ORDER — AMLODIPINE BESYLATE 10 MG PO TABS
10.0000 mg | ORAL_TABLET | Freq: Every day | ORAL | 1 refills | Status: DC
Start: 2021-04-12 — End: 2023-06-12

## 2021-04-12 MED ORDER — ONETOUCH VERIO W/DEVICE KIT: PACK | 0 refills | Status: DC

## 2021-04-12 MED ORDER — GLUCOSE BLOOD VI STRP: ORAL_STRIP | 12 refills | Status: DC

## 2021-04-12 MED ORDER — BD PEN NEEDLE MINI U/F 31G X 5 MM MISC: 6 refills | Status: DC

## 2021-04-12 MED ORDER — CLONIDINE HCL 0.2 MG PO TABS
0.2000 mg | ORAL_TABLET | Freq: Once | ORAL | Status: AC
  Administered 2021-04-12: 0.2 mg via ORAL

## 2021-04-12 MED ORDER — AMLODIPINE BESYLATE 10 MG PO TABS: 10.0000 mg | ORAL_TABLET | Freq: Every day | ORAL | 0 refills | Status: DC

## 2021-04-12 MED ORDER — TRULICITY 0.75 MG/0.5ML ~~LOC~~ SOAJ: 0.7500 mg | SUBCUTANEOUS | 6 refills | Status: DC

## 2021-04-12 MED ORDER — INSULIN GLARGINE 100 UNIT/ML SOLOSTAR PEN: 20.0000 [IU] | PEN_INJECTOR | Freq: Every day | SUBCUTANEOUS | 11 refills | Status: DC

## 2021-04-12 MED ORDER — SIMVASTATIN 20 MG PO TABS: 20.0000 mg | ORAL_TABLET | Freq: Every day | ORAL | 3 refills | Status: DC

## 2021-04-12 MED ORDER — ACCU-CHEK SOFTCLIX LANCETS MISC: 3 refills | Status: DC

## 2021-04-12 MED ORDER — METFORMIN HCL 1000 MG PO TABS: 1000.0000 mg | ORAL_TABLET | Freq: Two times a day (BID) | ORAL | 1 refills | Status: DC

## 2021-04-12 MED ORDER — LISINOPRIL-HYDROCHLOROTHIAZIDE 20-25 MG PO TABS: 1.0000 | ORAL_TABLET | Freq: Every day | ORAL | 3 refills | Status: DC

## 2021-04-12 NOTE — Progress Notes (Signed)
Assessment & Plan:  [name] was seen today for new patient (initial visit).  Diagnoses and all orders for this visit:  Type 2 diabetes mellitus with hyperglycemia, without long-term current use of insulin (HCC) -     Glucose (CBG) -     HgB A1c -     Dulaglutide (TRULICITY) 0.75 MG/0.5ML SOPN; Inject 0.75 mg into the skin once a week. -     Accu-Chek Softclix Lancets lancets; Use as instructed. Inject into the skin twice daily/ ICD 10 E11.65 -     glucose blood test strip; Use as instructed. Inject into the skin twice daily. E11.65 -     Blood Glucose Monitoring Suppl (ONETOUCH VERIO) w/Device KIT; Please provide patient with insurance approved GLUCOMETER ICD 10 E11.65 -     CMP14+EGFR -     TSH -     Microalbumin / creatinine urine ratio -     insulin glargine (LANTUS) 100 UNIT/ML Solostar Pen; Inject 20 Units into the skin daily at 10 pm. -     Insulin Pen Needle (B-D UF III MINI PEN NEEDLES) 31G X 5 MM MISC; Use as instructed. Inject into the skin once nightly. -     metFORMIN (GLUCOPHAGE) 1000 MG tablet; Take 1 tablet (1,000 mg total) by mouth 2 (two) times daily with a meal. Continue blood sugar control as discussed in office today, low carbohydrate diet, and regular physical exercise as tolerated, 150 minutes per week (30 min each day, 5 days per week, or 50 min 3 days per week). Keep blood sugar logs with fasting goal of 90-130 mg/dl, post prandial (after you eat) less than 180.  For Hypoglycemia: BS <60 and Hyperglycemia BS >400; contact the clinic ASAP. Annual eye exams and foot exams are recommended.   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 -      lisinopril-hydrochlorothiazide (ZESTORETIC) 20-25 MG tablet; Take 1 tablet by mouth daily. -     CMP14+EGFR -     amLODipine (NORVASC) 10 MG tablet; Take 1 tablet (10 mg total) by mouth daily. Continue all antihypertensives as prescribed.  Remember to bring in your blood pressure log with you for your follow up appointment.  DASH/Mediterranean Diets are healthier choices for HTN.    Dyslipidemia, goal LDL below 70 -     simvastatin (ZOCOR) 20 MG tablet; Take 1 tablet (20 mg total) by mouth at bedtime. -     Lipid panel; Future INSTRUCTIONS: Work on a low fat, heart healthy diet and participate in regular aerobic exercise program by working out at least 150 minutes per week; 5 days a week-30 minutes per day. Avoid red meat/beef/steak,  fried foods. junk foods, sodas, sugary drinks, unhealthy snacking, alcohol and smoking.  Drink at least 80 oz of water per day and monitor your carbohydrate intake daily.    Morbid obesity with BMI of 45.0-49.9, adult (HCC) -     TSH  Breast cancer screening by mammogram -     MM 3D SCREEN BREAST BILATERAL; Future  Colon cancer screening -     Ambulatory referral to Gastroenterology  Need for hepatitis C screening test -     HCV Ab w Reflex  to Quant PCR  Anemia, unspecified type -     CBC    Patient has been counseled on age-appropriate routine health concerns for screening and prevention. These are reviewed and up-to-date. Referrals have been placed accordingly. Immunizations are up-to-date or declined.    Subjective:   Chief Complaint  Patient presents with  . New Patient (Initial Visit)   HPI Kathleen Stout 50 y.o. female presents to office today for for to establish care. She has not seen her primary care over a year and has been out of her medications as well. She as a past medical history of Anemia, Arthritis, Diabetic retinopathy, Essential hypertension, Morbid obesity, and Type II diabetes mellitus poorly controlled.    Patient has  been counseled on age-appropriate routine health concerns for screening and prevention. These are reviewed and up-to-date. Referrals have been placed accordingly. Immunizations are up-to-date or declined.    Mammogram: Overdue.  Refer to the breast clinic Pap smear: Hysterectomy   DM 2  Poorly controlled. BMI 49. Will restart Trulicity 4.23 weekly, metformin 1000 mg twice daily and will add Lantus 20 units nightly.  LDL not at goal.  Will increase simvastatin from 10 mg to 20 mg daily.  She also has a history of diabetic/hypertensive retinopathy.  Referral was made to ophthalmology today. Lab Results  Component Value Date   HGBA1C 13.4 (A) 04/12/2021   Lab Results  Component Value Date   LDLCALC 102 (H) 05/29/2018    Essential Hypertension Poorly controlled.  She required 0.2 mg of clonidine here in the office today.  We will restart amlodipine 10 mg daily and restart Zestoretic at 20-25 mg daily. Denies chest pain, shortness of breath, palpitations, lightheadedness, dizziness, headaches or BLE edema.  Diet and exercise are not optimized BP Readings from Last 3 Encounters:  04/12/21 (!) 165/112  04/23/20 (!) 178/100  11/25/18 (!) 160/100      Review of Systems  Constitutional: Negative for fever, malaise/fatigue and weight loss.  HENT: Negative.  Negative for nosebleeds.   Eyes: Negative.  Negative for blurred vision, double vision and photophobia.  Respiratory: Negative.  Negative for cough and shortness of breath.   Cardiovascular: Negative.  Negative for chest pain, palpitations and leg swelling.  Gastrointestinal: Negative.  Negative for heartburn, nausea and vomiting.  Musculoskeletal: Negative.  Negative for myalgias.       Reports right leg pain when she raises her leg from a recumbent position but no pain with weight bearing. Denies any swelling  Neurological: Negative.  Negative for dizziness, focal weakness, seizures and headaches.  Psychiatric/Behavioral: Negative.   Negative for suicidal ideas.    Past Medical History:  Diagnosis Date  . Anemia   . Arthritis   . Chest pressure    a. 08/2016 Myoview: EF 68%, no ST changes, basal anterior/mid anterior defect - felt to be attenuation-->Low risk.  . Essential hypertension   . Morbid obesity ([place])   . Type II diabetes mellitus ([place])     Past Surgical History:  Procedure Laterality Date  . ABDOMINAL HYSTERECTOMY      Family History  Problem Relation Age of Onset  . Diabetes Mother   . Hypertension Mother   . Hyperlipidemia Mother   . Heart disease Mother   . Aneurysm Mother 35  . Healthy Sister   . Healthy Brother   . Healthy Sister   . Healthy Sister   . Healthy Sister   . Diabetes Brother   . Hypertension Brother   .  Healthy Brother   . Healthy Brother   . Healthy Brother   . Healthy Brother   . Healthy Brother   . Healthy Brother   . Healthy Brother   . Healthy Brother     Social History Reviewed with no changes to be made today.   Outpatient Medications Prior to Visit  Medication Sig Dispense Refill  . albuterol (PROVENTIL HFA;VENTOLIN HFA) 108 (90 Base) MCG/ACT inhaler Inhale 1-2 puffs into the lungs every 6 (six) hours as needed for wheezing or shortness of breath. (Patient not taking: Reported on 04/12/2021)    . amoxicillin-clavulanate (AUGMENTIN) 875-125 MG tablet Take 1 tablet by mouth every 12 (twelve) hours. 14 tablet 0  . benzonatate (TESSALON) 100 MG capsule Take 1 capsule (100 mg total) by mouth every 8 (eight) hours. 21 capsule 0  . Blood Glucose Monitoring Suppl (TRUE METRIX METER) DEVI 1 kit by Does not apply route as directed. 1 Device 0  . Dulaglutide (TRULICITY) 2.67 TI/4.5YK SOPN Inject 0.$RemoveBefor'75mg'uLyAlEhUMAFb$  weekly (Patient not taking: No sig reported) 4 pen 3  . glucose blood (TRUE METRIX BLOOD GLUCOSE TEST) test strip Use as instructed 100 each 12  . lisinopril-hydrochlorothiazide (PRINZIDE,ZESTORETIC) 10-12.5 MG tablet Take 1 tablet by mouth daily. (Patient not taking:  Reported on 04/12/2021) 30 tablet 1  . metFORMIN (GLUCOPHAGE) 1000 MG tablet Take 1 tablet (1,000 mg total) by mouth 2 (two) times daily with a meal. (Patient not taking: Reported on 04/12/2021) 60 tablet 3  . simvastatin (ZOCOR) 10 MG tablet Take 1 tablet (10 mg total) by mouth at bedtime. (Patient not taking: Reported on 04/12/2021) 30 tablet 1  . TRUEPLUS LANCETS 28G MISC Use as directed 100 each 12   Facility-Administered Medications Prior to Visit  Medication Dose Route Frequency Provider Last Rate Last Admin  . technetium tetrofosmin (TC-MYOVIEW) injection 99.8 millicurie  33.8 millicurie Intravenous Once PRN Dorothy Spark, MD        No Known Allergies     Objective:    BP (!) 165/112 (BP Location: Left Arm, Cuff Size: Normal)   Pulse 82   Resp 20   Ht 5' 3.5" (1.613 m)   Wt 286 lb (129.7 kg)   SpO2 97%   BMI 49.87 kg/m  Wt Readings from Last 3 Encounters:  04/12/21 286 lb (129.7 kg)  04/23/20 (!) 320 lb (145.2 kg)  11/25/18 (!) 321 lb 6.4 oz (145.8 kg)    Physical Exam Vitals and nursing note reviewed.  Constitutional:      Appearance: She is well-developed. She is obese.  HENT:     Head: Normocephalic and atraumatic.  Cardiovascular:     Rate and Rhythm: Normal rate and regular rhythm.     Heart sounds: Normal heart sounds. No murmur heard. No friction rub. No gallop.   Pulmonary:     Effort: Pulmonary effort is normal. No tachypnea or respiratory distress.     Breath sounds: Normal breath sounds. No decreased breath sounds, wheezing, rhonchi or rales.  Chest:     Chest wall: No tenderness.  Abdominal:     General: Bowel sounds are normal.     Palpations: Abdomen is soft.  Musculoskeletal:        General: Normal range of motion.     Cervical back: Normal range of motion.  Skin:    General: Skin is warm and dry.  Neurological:     Mental Status: She is alert and oriented to person, place, and time.     Coordination:  Coordination normal.  Psychiatric:         Behavior: Behavior normal. Behavior is cooperative.        Thought Content: Thought content normal.        Judgment: Judgment normal.          Patient has been counseled extensively about nutrition and exercise as well as the importance of adherence with medications and regular follow-up. The patient was given clear instructions to go to ER or return to medical center if symptoms don't improve, worsen or new problems develop. The patient verbalized understanding.   Follow-up: Return for LUKE 4 weeks BP and meter check. See me in 3 months.   Gildardo Pounds, FNP-BC [facility] and [facility] [place], [place]   04/12/2021, 3:14 PM

## 2021-04-12 NOTE — Progress Notes (Signed)
New patient- establish care  Right leg pain x several months  Notices when she elevates leg Denies pain in sitting position  Unable to sleep

## 2021-04-13 LAB — CMP14+EGFR
ALT: 24 IU/L (ref 0–32)
AST: 18 IU/L (ref 0–40)
Albumin/Globulin Ratio: 1.6 (ref 1.2–2.2)
Albumin: 4.1 g/dL (ref 3.8–4.9)
Alkaline Phosphatase: 84 IU/L (ref 44–121)
BUN/Creatinine Ratio: 11 (ref 9–23)
BUN: 10 mg/dL (ref 6–24)
Bilirubin Total: 0.4 mg/dL (ref 0.0–1.2)
CO2: 21 mmol/L (ref 20–29)
Calcium: 8.7 mg/dL (ref 8.7–10.2)
Chloride: 95 mmol/L — ABNORMAL LOW (ref 96–106)
Creatinine, Ser: 0.89 mg/dL (ref 0.57–1.00)
Globulin, Total: 2.6 g/dL (ref 1.5–4.5)
Glucose: 430 mg/dL — ABNORMAL HIGH (ref 65–99)
Potassium: 4.2 mmol/L (ref 3.5–5.2)
Sodium: 136 mmol/L (ref 134–144)
Total Protein: 6.7 g/dL (ref 6.0–8.5)
eGFR: 78 mL/min/{1.73_m2} (ref >59)

## 2021-04-13 LAB — TSH: TSH: 1.48 u[IU]/mL (ref 0.450–4.500)

## 2021-04-13 LAB — CBC
Hematocrit: 43.7 % (ref 34.0–46.6)
Hemoglobin: 14.9 g/dL (ref 11.1–15.9)
MCH: 30.3 pg (ref 26.6–33.0)
MCHC: 34.1 g/dL (ref 31.5–35.7)
MCV: 89 fL (ref 79–97)
Platelets: 187 10*3/uL (ref 150–450)
RBC: 4.92 x10E6/uL (ref 3.77–5.28)
RDW: 12.3 % (ref 11.7–15.4)
WBC: 7.1 10*3/uL (ref 3.4–10.8)

## 2021-04-13 LAB — HCV AB W REFLEX TO QUANT PCR: HCV Ab: <0.1 s/co ratio (ref 0.0–0.9)

## 2021-04-13 LAB — HCV INTERPRETATION

## 2021-04-13 LAB — MICROALBUMIN / CREATININE URINE RATIO
Creatinine, Urine: 46.4 mg/dL
Microalb/Creat Ratio: 10 mg/g creat (ref 0–29)
Microalbumin, Urine: 4.6 ug/mL

## 2021-05-05 ENCOUNTER — Encounter: Payer: Self-pay | Admitting: Gastroenterology

## 2021-05-06 ENCOUNTER — Other Ambulatory Visit: Payer: Self-pay

## 2021-05-06 ENCOUNTER — Encounter: Payer: Self-pay | Admitting: Pharmacist

## 2021-05-06 ENCOUNTER — Ambulatory Visit: Payer: 59 | Attending: Nurse Practitioner | Admitting: Pharmacist

## 2021-05-06 VITALS — BP 140/88

## 2021-05-06 DIAGNOSIS — E1165 Type 2 diabetes mellitus with hyperglycemia: Secondary | ICD-10-CM | POA: Diagnosis not present

## 2021-05-06 LAB — GLUCOSE, POCT (MANUAL RESULT ENTRY): POC Glucose: 292 mg/dl — AB (ref 70–99)

## 2021-05-06 MED ORDER — METFORMIN HCL ER 500 MG PO TB24: ORAL_TABLET | ORAL | 2 refills | Status: DC

## 2021-05-06 MED ORDER — INSULIN GLARGINE 100 UNIT/ML SOLOSTAR PEN: 30.0000 [IU] | PEN_INJECTOR | Freq: Every day | SUBCUTANEOUS | 11 refills | Status: DC

## 2021-05-06 NOTE — Progress Notes (Signed)
S:    PCP: Zelda   No chief complaint on file.  Patient arrives in good spirits.  Presents for diabetes evaluation, education, and management. Patient was referred and last seen by Primary Care Provider on 04/12/2021. She was without her medications for over a year prior to that visit. She was restarted on Trulicity and metformin. Lantus was added as well. Additionally, lisinopril-HCTZ and amlodipine were both restarted as pt's BP was initially 187/127 mmHg.   Today, patient reports Diabetes was diagnosed ~5-6 years ago. Denies pancreatitis history. She denies any familial or personal hx of thyroid cancer. She is tolerating her medications okay. Endorses diarrhea with metformin use. No issues with Trulicity or insulin. Denies NV, abdominal pain. She denies issues with her antihypertensives. Denies any HA, chest pain, dyspnea, or LE edema. Endorses fatigue, blurred vision, and polydipsia.   Family/Social History:  -Fhx: DM, HTN, HLD, heart disease -Tobacco: never  -Alcohol: denies use   Insurance coverage/medication affordability: [employer]   Medication adherence reported denied. Is not taking metformin consistently d/t diarrhea. Reports adherence with Trulicity and Lantus.  She endorses good compliance with her HTN medications and statin.  Current diabetes medications include: dulaglutide 0.75 mg weekly, metformin 1000 mg BID, Lantus 20u daily  Current hypertension medications include: amlodipine 10 mg daily, lisinopril-HCTZ 20-25 mg daily  Current hyperlipidemia medications include: simvastatin 20 mg daily   Patient denies hypoglycemic events.  Patient reported dietary habits:  - trying to do better with her diet  - admits to occasional dietary indiscretion   Patient-reported exercise habits: none    Patient denies nocturia (nighttime urination).  Patient reports neuropathy (nerve pain). Patient reports visual changes. Patient reports self foot exams.     O:  POCT  glucose: 292   Lab Results  Component Value Date   HGBA1C 13.4 (A) 04/12/2021   Vitals:   05/06/21 1527  BP: 140/88   Lipid Panel     Component Value Date/Time   CHOL 168 05/29/2018 1024   TRIG 81 05/29/2018 1024   HDL 50 05/29/2018 1024   CHOLHDL 3.4 05/29/2018 1024   CHOLHDL 3.7 04/20/2015 1505   VLDL 20 04/20/2015 1505   LDLCALC 102 (H) 05/29/2018 1024    Home fasting blood sugars: 300-500s  2 hour post-meal/random blood sugars: 300-500.  Clinical Atherosclerotic Cardiovascular Disease (ASCVD): No  The 10-year ASCVD risk score Denman George DC Jr., et al., 2013) is: 4.4%   Values used to calculate the score:     Age: 60 years     Sex: Female     Is Non-Hispanic African American: No     Diabetic: Yes     Tobacco smoker: No     Systolic Blood Pressure: 140 mmHg     Is BP treated: Yes     HDL Cholesterol: 52 MG/DL     Total Cholesterol: 197 MG/DL   A/P: Diabetes longstanding currently uncontrolled. Patient is able to verbalize appropriate hypoglycemia management plan. Medication adherence appears suboptimal. Will change metformin to metformin XR. Will increase Lantus and continue same Trulicity dose for now. -Continued Trulicity 0.75 mg weekly.  -Increase Lantus to 30 units daily.  -Stop metformin.  -Start metformin 500 mg XR. Take 500 mg BID x1 week then increase to 1000mg  BID if tolerable. -Extensively discussed pathophysiology of diabetes, recommended lifestyle interventions, dietary effects on blood sugar control -Counseled on s/sx of and management of hypoglycemia -Next A1C anticipated 06/2021.   ASCVD risk - primary prevention in patient with  diabetes. Last LDL is not controlled. We need updated lipid. ASCVD risk score is not >20% at this time. At least moderate intensity statin indicated. Will plan for lipid panel at follow-up.  -Continued simvastatin 20 mg daily for now.   Hypertension longstanding currently above goal but improved drastically.  Blood pressure goal =  130/80 mmHg. Medication adherence reported. Will continue current regimen given improvement.  -Continue amlodipine, lisinopril-HCTZ at current doses.   Written patient instructions provided.  Total time in face to face counseling 30 minutes.   Follow up Pharmacist Clinic Visit in 2 weeks.    Butch Penny, PharmD, [place], CPP Clinical Pharmacist [facility] & [facility] [phone]

## 2021-05-06 NOTE — Patient Instructions (Signed)
Thank you for coming to see me today. Please do the following:  Increase Lantus to 30 units daily.   Stop metformin that you have now.  Start XR metformin. Take 2 tablets after supper for 1 week. If your tummy feels okay after 1 week, increase to 2 tablets in the morning and 2 tablets in the evening.  Continue Trulicity.  Continue checking blood sugars at home. Continue making the lifestyle changes we've discussed together during our visit. Diet and exercise play a significant role in improving your blood sugars.  Follow-up with me in 2 weeks.    Hypoglycemia or low blood sugar:   Low blood sugar can happen quickly and may become an emergency if not treated right away.   While this shouldn't happen often, it can be brought upon if you skip a meal or do not eat enough. Also, if your insulin or other diabetes medications are dosed too high, this can cause your blood sugar to go to low.   Warning signs of low blood sugar include: Feeling shaky or dizzy Feeling weak or tired  Excessive hunger Feeling anxious or upset  Sweating even when you aren't exercising  What to do if I experience low blood sugar? Check your blood sugar with your meter. If lower than 70, proceed to step 2.  Treat with 3-4 glucose tablets or 3 packets of regular sugar. If these aren't around, you can try hard candy. Yet another option would be to drink 4 ounces of fruit juice or 6 ounces of REGULAR soda.  Re-check your sugar in 15 minutes. If it is still below 70, do what you did in step 2 again. If has come back up, go ahead and eat a snack or small meal at this time.

## 2021-05-20 ENCOUNTER — Other Ambulatory Visit: Payer: Self-pay

## 2021-05-20 ENCOUNTER — Ambulatory Visit: Payer: 59 | Attending: Nurse Practitioner | Admitting: Pharmacist

## 2021-05-20 ENCOUNTER — Encounter: Payer: Self-pay | Admitting: Pharmacist

## 2021-05-20 VITALS — BP 127/85

## 2021-05-20 DIAGNOSIS — E785 Hyperlipidemia, unspecified: Secondary | ICD-10-CM | POA: Diagnosis not present

## 2021-05-20 DIAGNOSIS — E1165 Type 2 diabetes mellitus with hyperglycemia: Secondary | ICD-10-CM

## 2021-05-20 MED ORDER — INSULIN GLARGINE 100 UNIT/ML SOLOSTAR PEN: 36.0000 [IU] | PEN_INJECTOR | Freq: Every day | SUBCUTANEOUS | 2 refills | Status: DC

## 2021-05-20 MED ORDER — TRULICITY 1.5 MG/0.5ML ~~LOC~~ SOAJ: 1.5000 mg | SUBCUTANEOUS | 2 refills | Status: DC

## 2021-05-20 NOTE — Progress Notes (Signed)
    S:    PCP: Zelda   No chief complaint on file.  Patient arrives in good spirits.  Presents for diabetes evaluation, education, and management. Patient was referred and last seen by Primary Care Provider on 04/12/2021. I saw her on 05/06/2021  and increased Lantus to 30 units daily. I also changed her metformin to XR.   Today, she reports doing well. She is tolerating the metformin and she has noticed some improvement in her blood sugar.   Family/Social History:  -Fhx: DM, HTN, HLD, heart disease -Tobacco: never  -Alcohol: denies use   Insurance coverage/medication affordability: [employer]   Medication adherence reported.  Current diabetes medications include: dulaglutide 0.75 mg weekly, metformin 1000 mg BID (takes the 500mg  XR tablets), Lantus 30u daily  Current hypertension medications include: amlodipine 10 mg daily, lisinopril-HCTZ 20-25 mg daily  Current hyperlipidemia medications include: simvastatin 20 mg daily   Patient denies hypoglycemic events.  Patient reported dietary habits:  - trying to do better with her diet  - admits to occasional dietary indiscretion   Patient-reported exercise habits: none    Patient denies nocturia (nighttime urination).  Patient reports neuropathy (nerve pain). Patient reports visual changes. Patient reports self foot exams.     O:  Lab Results  Component Value Date   HGBA1C 13.4 (A) 04/12/2021   Vitals:   05/20/21 1731  BP: 127/85   Lipid Panel     Component Value Date/Time   CHOL 168 05/29/2018 1024   TRIG 81 05/29/2018 1024   HDL 50 05/29/2018 1024   CHOLHDL 3.4 05/29/2018 1024   CHOLHDL 3.7 04/20/2015 1505   VLDL 20 04/20/2015 1505   LDLCALC 102 (H) 05/29/2018 1024    Home fasting blood sugars: 170s-200s 2 hour post-meal/random blood sugars: 200s  Clinical Atherosclerotic Cardiovascular Disease (ASCVD): No  The 10-year ASCVD risk score 07/30/2018 DC Jr., et al., 2013) is: 3.6%   Values used to calculate the  score:     Age: 50 years     Sex: Female     Is Non-Hispanic African American: No     Diabetic: Yes     Tobacco smoker: No     Systolic Blood Pressure: 127 mmHg     Is BP treated: Yes     HDL Cholesterol: 52 MG/DL     Total Cholesterol: 197 MG/DL   A/P: Diabetes longstanding currently uncontrolled. Patient is able to verbalize appropriate hypoglycemia management plan. Medication adherence appears appropriate. -Increase Trulicity to 1.5 mg weekly.  -Increase Lantus to 36 units daily.  -Continue metformin 500mg  XR. Take 1000mg  BID. -Extensively discussed pathophysiology of diabetes, recommended lifestyle interventions, dietary effects on blood sugar control -Counseled on s/sx of and management of hypoglycemia -Next A1C anticipated 06/2021.   ASCVD risk - primary prevention in patient with diabetes. Last LDL is not controlled. We need updated lipid. ASCVD risk score is not >20% at this time. At least moderate intensity statin indicated.  -Continued simvastatin 20 mg daily for now. -Lipid   Hypertension longstanding currently close to goal. Blood pressure goal = 130/80 mmHg. Medication adherence reported. Will continue current regimen given improvement.  -Continue amlodipine, lisinopril-HCTZ at current doses.   Written patient instructions provided.  Total time in face to face counseling 30 minutes.   Follow up Pharmacist Clinic Visit in 1 month.    , PharmD, , CPP Clinical Pharmacist [facility] & [facility] [phone]

## 2021-05-21 LAB — LIPID PANEL
Chol/HDL Ratio: 3.4 ratio (ref 0.0–4.4)
Cholesterol, Total: 137 mg/dL (ref 100–199)
HDL: 40 mg/dL (ref >39)
LDL Chol Calc (NIH): 72 mg/dL (ref 0–99)
Triglycerides: 142 mg/dL (ref 0–149)
VLDL Cholesterol Cal: 25 mg/dL (ref 5–40)

## 2021-06-09 ENCOUNTER — Encounter: Payer: Self-pay | Admitting: Nurse Practitioner

## 2021-06-10 ENCOUNTER — Other Ambulatory Visit: Payer: Self-pay | Admitting: Nurse Practitioner

## 2021-06-10 MED ORDER — GABAPENTIN 300 MG PO CAPS: 300.0000 mg | ORAL_CAPSULE | Freq: Three times a day (TID) | ORAL | 3 refills | Status: DC

## 2021-06-15 ENCOUNTER — Emergency Department (HOSPITAL_COMMUNITY)
Admission: EM | Admit: 2021-06-15 | Discharge: 2021-06-15 | Disposition: A | Discharge status: Home / Self Care | Payer: 59 | Attending: Emergency Medicine | Admitting: Emergency Medicine

## 2021-06-15 ENCOUNTER — Other Ambulatory Visit: Payer: Self-pay

## 2021-06-15 ENCOUNTER — Ambulatory Visit (HOSPITAL_COMMUNITY)
Admission: EM | Admit: 2021-06-15 | Discharge: 2021-06-15 | Disposition: A | Discharge status: Home / Self Care | Payer: 59 | Source: Home / Self Care

## 2021-06-15 ENCOUNTER — Encounter (HOSPITAL_COMMUNITY): Payer: Self-pay | Admitting: Emergency Medicine

## 2021-06-15 DIAGNOSIS — Z5321 Procedure and treatment not carried out due to patient leaving prior to being seen by health care provider: Secondary | ICD-10-CM | POA: Insufficient documentation

## 2021-06-15 DIAGNOSIS — A059 Bacterial foodborne intoxication, unspecified: Secondary | ICD-10-CM

## 2021-06-15 DIAGNOSIS — R202 Paresthesia of skin: Secondary | ICD-10-CM | POA: Insufficient documentation

## 2021-06-15 DIAGNOSIS — Z20822 Contact with and (suspected) exposure to covid-19: Secondary | ICD-10-CM | POA: Diagnosis not present

## 2021-06-15 DIAGNOSIS — R197 Diarrhea, unspecified: Secondary | ICD-10-CM | POA: Insufficient documentation

## 2021-06-15 DIAGNOSIS — R1110 Vomiting, unspecified: Secondary | ICD-10-CM | POA: Diagnosis not present

## 2021-06-15 DIAGNOSIS — K529 Noninfective gastroenteritis and colitis, unspecified: Secondary | ICD-10-CM | POA: Diagnosis not present

## 2021-06-15 DIAGNOSIS — E876 Hypokalemia: Secondary | ICD-10-CM

## 2021-06-15 DIAGNOSIS — R112 Nausea with vomiting, unspecified: Secondary | ICD-10-CM | POA: Insufficient documentation

## 2021-06-15 DIAGNOSIS — R109 Unspecified abdominal pain: Secondary | ICD-10-CM | POA: Insufficient documentation

## 2021-06-15 LAB — CBC
HCT: 43.4 % (ref 36.0–46.0)
Hemoglobin: 14.5 g/dL (ref 12.0–15.0)
MCH: 29.7 pg (ref 26.0–34.0)
MCHC: 33.4 g/dL (ref 30.0–36.0)
MCV: 88.9 fL (ref 80.0–100.0)
Platelets: 244 10*3/uL (ref 150–400)
RBC: 4.88 MIL/uL (ref 3.87–5.11)
RDW: 12.9 % (ref 11.5–15.5)
WBC: 9.4 10*3/uL (ref 4.0–10.5)
nRBC: 0 % (ref 0.0–0.2)

## 2021-06-15 LAB — POCT URINALYSIS DIPSTICK, ED / UC
Glucose, UA: NEGATIVE mg/dL
Hgb urine dipstick: NEGATIVE
Leukocytes,Ua: NEGATIVE
Nitrite: NEGATIVE
Protein, ur: NEGATIVE mg/dL
Specific Gravity, Urine: >=1.03 (ref 1.005–1.030)
Urobilinogen, UA: 0.2 mg/dL (ref 0.0–1.0)
pH: 5 (ref 5.0–8.0)

## 2021-06-15 LAB — COMPREHENSIVE METABOLIC PANEL
ALT: 26 U/L (ref 0–44)
AST: 23 U/L (ref 15–41)
Albumin: 3.7 g/dL (ref 3.5–5.0)
Alkaline Phosphatase: 58 U/L (ref 38–126)
Anion gap: 10 (ref 5–15)
BUN: 10 mg/dL (ref 6–20)
CO2: 27 mmol/L (ref 22–32)
Calcium: 9.3 mg/dL (ref 8.9–10.3)
Chloride: 100 mmol/L (ref 98–111)
Creatinine, Ser: 1.03 mg/dL — ABNORMAL HIGH (ref 0.44–1.00)
GFR, Estimated: >60 mL/min (ref >60)
Glucose, Bld: 189 mg/dL — ABNORMAL HIGH (ref 70–99)
Potassium: 3.3 mmol/L — ABNORMAL LOW (ref 3.5–5.1)
Sodium: 137 mmol/L (ref 135–145)
Total Bilirubin: 0.9 mg/dL (ref 0.3–1.2)
Total Protein: 7.2 g/dL (ref 6.5–8.1)

## 2021-06-15 LAB — LIPASE, BLOOD: Lipase: 23 U/L (ref 11–51)

## 2021-06-15 MED ORDER — POTASSIUM CHLORIDE ER 10 MEQ PO TBCR: 10.0000 meq | EXTENDED_RELEASE_TABLET | Freq: Every day | ORAL | 0 refills | Status: DC

## 2021-06-15 MED ORDER — ONDANSETRON HCL 4 MG PO TABS: 4.0000 mg | ORAL_TABLET | Freq: Four times a day (QID) | ORAL | 0 refills | Status: DC | PRN

## 2021-06-15 NOTE — ED Triage Notes (Signed)
Patient here with abdominal pain, vomiting, and diarrhea that started approximately one hour after eating chinese food last night. Patient alert, oriented, and in no apparent distress at this time.

## 2021-06-15 NOTE — ED Provider Notes (Addendum)
[facility]    CSN: [id number] Arrival date & time: 06/15/21  [place]      History   Chief Complaint Chief Complaint  Patient presents with   Headache   Abdominal Pain   Emesis    HPI Kathleen Stout is a 50 y.o. female.   Patient presents with 1 day history of headache, nausea, vomiting, abdominal pain, diarrhea.  Patient states that symptoms started approximately 1 hour after eating [place] food last night.  Denies any known fevers.  Abdominal pain is present to upper portion of abdomen.  Denies any blood in stool or emesis.  Patient has had approximately 4 vomiting episodes since last night.  Patient is not able to calculate how many times that she has had diarrhea but states that she has "had it all night last night".  Denies any known sick contacts.  Denies any upper respiratory symptoms.  Was triaged at [facility] ED where blood work was completed but came to urgent care due to long wait time.  Patient also having some lower back pain but denies any urinary burning or frequency.   Headache Abdominal Pain Emesis  Past Medical History:  Diagnosis Date   Anemia    Arthritis    Chest pressure    a. 08/2016 Myoview: EF 68%, no ST changes, basal anterior/mid anterior defect - felt to be attenuation-->Low risk.   Essential hypertension    Morbid obesity ([place])    Type II diabetes mellitus ([place])     Patient Active Problem List   Diagnosis Date Noted   Wheezing 11/25/2018   Influenza B 11/25/2018   Pneumonia due to infectious organism 11/25/2018   Depression, recurrent ([place]) 05/29/2018   Chest pressure    Chest pain 09/19/2016   Morbid obesity ([place])    Type II diabetes mellitus ([place])    Morbid obesity with BMI of 50.0-59.9, adult ([place]) 08/02/2015   Diabetes mellitus type 2 in obese ([place]) 08/02/2015   Essential hypertension 05/21/2015   Osteoarthritis of left knee 05/21/2015   Knee pain, left 04/20/2015    Past Surgical History:  Procedure Laterality Date    ABDOMINAL HYSTERECTOMY      OB History   No obstetric history on file.      Home Medications    Prior to Admission medications   Medication Sig Start Date End Date Taking? Authorizing Provider  Accu-Chek Softclix Lancets lancets Use as instructed. Inject into the skin twice daily/ ICD 10 E11.65 04/12/21  Yes Gildardo Pounds, NP  amLODipine (NORVASC) 10 MG tablet Take 1 tablet (10 mg total) by mouth daily. 04/12/21  Yes Gildardo Pounds, NP  Blood Glucose Monitoring Suppl (ONETOUCH VERIO) w/Device KIT Please provide patient with insurance approved GLUCOMETER ICD 10 E11.65 04/12/21  Yes Gildardo Pounds, NP  Dulaglutide (TRULICITY) 1.5 JK/9.3OI SOPN Inject 1.5 mg into the skin once a week. 05/20/21  Yes Charlott Rakes, MD  gabapentin (NEURONTIN) 300 MG capsule Take 1 capsule (300 mg total) by mouth 3 (three) times daily. 06/10/21  Yes Gildardo Pounds, NP  glucose blood test strip Use as instructed. Inject into the skin twice daily. E11.65 04/12/21  Yes Gildardo Pounds, NP  insulin glargine (LANTUS) 100 UNIT/ML Solostar Pen Inject 36 Units into the skin daily at 10 pm. 05/20/21 06/19/21 Yes Newlin, Charlane Ferretti, MD  Insulin Pen Needle (B-D UF III MINI PEN NEEDLES) 31G X 5 MM MISC Use as instructed. Inject into the skin once nightly. 04/12/21  Yes Raul Del,  Vernia Buff, NP  lisinopril-hydrochlorothiazide (ZESTORETIC) 20-25 MG tablet Take 1 tablet by mouth daily. 04/12/21  Yes Gildardo Pounds, NP  metFORMIN (GLUCOPHAGE-XR) 500 MG 24 hr tablet Take 2 tablets by mouth after your largest meal x1 week. Then, increase to 2 tablets in the morning and 2 in the evening. 05/06/21  Yes Charlott Rakes, MD  ondansetron (ZOFRAN) 4 MG tablet Take 1 tablet (4 mg total) by mouth every 6 (six) hours as needed for nausea or vomiting. 06/15/21  Yes Odis Luster, FNP  potassium chloride (KLOR-CON) 10 MEQ tablet Take 1 tablet (10 mEq total) by mouth daily. 06/15/21  Yes Odis Luster, FNP  simvastatin (ZOCOR) 20 MG tablet Take 1  tablet (20 mg total) by mouth at bedtime. 04/12/21 07/11/21 Yes Gildardo Pounds, NP  albuterol (PROVENTIL HFA;VENTOLIN HFA) 108 (90 Base) MCG/ACT inhaler Inhale 1-2 puffs into the lungs every 6 (six) hours as needed for wheezing or shortness of breath. Patient not taking: Reported on 04/12/2021    [provider]    Family History Family History  Problem Relation Age of Onset   Diabetes Mother    Hypertension Mother    Hyperlipidemia Mother    Heart disease Mother    Aneurysm Mother 41   Healthy Sister    Healthy Brother    Healthy Sister    Healthy Sister    Healthy Sister    Diabetes Brother    Hypertension Brother    Healthy Brother    Healthy Brother    Healthy Brother    Healthy Brother    Healthy Brother    Healthy Brother    Healthy Brother    Healthy Brother     Social History Social History   Tobacco Use   Smoking status: Never   Smokeless tobacco: Never  Vaping Use   Vaping Use: Never used  Substance Use Topics   Alcohol use: No   Drug use: No     Allergies   Patient has no known allergies.   Review of Systems Review of Systems Per HPI  Physical Exam Triage Vital Signs ED Triage Vitals [06/15/21 1845]  Enc Vitals Group     BP 128/84     Pulse Rate 97     Resp 20     Temp 98.8 F (37.1 C)     Temp Source Oral     SpO2 96 %     Weight      Height      Head Circumference      Peak Flow      Pain Score 10     Pain Loc      Pain Edu?      Excl. in [place]?    No data found.  Updated Vital Signs BP 128/84   Pulse 97   Temp 98.8 F (37.1 C) (Oral)   Resp 20   SpO2 96%   Visual Acuity Right Eye Distance:   Left Eye Distance:   Bilateral Distance:    Right Eye Near:   Left Eye Near:    Bilateral Near:     Physical Exam Constitutional:      General: She is not in acute distress.    Appearance: Normal appearance.  HENT:     Head: Normocephalic and atraumatic.  Eyes:     Extraocular Movements: Extraocular movements  intact.     Conjunctiva/sclera: Conjunctivae normal.  Cardiovascular:     Rate and Rhythm: Normal rate and regular  rhythm.     Heart sounds: Normal heart sounds.  Pulmonary:     Effort: Pulmonary effort is normal. No respiratory distress.     Breath sounds: Normal breath sounds. No wheezing, rhonchi or rales.  Abdominal:     Palpations: Abdomen is soft.     Tenderness: There is abdominal tenderness in the right upper quadrant and left upper quadrant.     Hernia: No hernia is present.     Comments: Tenderness palpation across upper abdomen.  Skin:    General: Skin is warm and dry.  Neurological:     General: No focal deficit present.     Mental Status: She is alert and oriented to person, place, and time. Mental status is at baseline.  Psychiatric:        Mood and Affect: Mood normal.        Behavior: Behavior normal.        Thought Content: Thought content normal.        Judgment: Judgment normal.     UC Treatments / Results  Labs (all labs ordered are listed, but only abnormal results are displayed) Labs Reviewed  POCT URINALYSIS DIPSTICK, ED / UC - Abnormal; Notable for the following components:      Result Value   Bilirubin Urine SMALL (*)    Ketones, ur TRACE (*)    All other components within normal limits  URINE CULTURE  SARS CORONAVIRUS 2 (TAT 6-24 HRS)  POCT URINALYSIS DIPSTICK, ED / UC    EKG   Radiology No results found.  Procedures Procedures (including critical care time)  Medications Ordered in UC Medications - No data to display  Initial Impression / Assessment and Plan / UC Course  I have reviewed the triage vital signs and the nursing notes.  Pertinent labs & imaging results that were available during my care of the patient were reviewed by me and considered in my medical decision making (see chart for details).     COVID-19 viral swab pending.  Urinalysis was negative for any urinary tract infection.  Urine culture is pending.  Symptoms  likely related to food poisoning or gastroenteritis.  Ondansetron prescribed as needed for nausea.  Patient advised to increase clear oral fluid intake to prevent dehydration.  CMP completed today in ED showing potassium at 3.3.  Suspect this is related to dehydration, vomiting, diarrhea.  Patient wishes to have potassium supplementation.  Will treat with a 10 MEQ's potassium daily for 4 days.  Patient advised of the importance of following up with PCP for blood work in the next few days to recheck potassium.  Patient voiced understanding.  Patient advised to go to the hospital if any symptoms worsen or if abdominal pain worsens.Discussed strict return precautions. Patient verbalized understanding and is agreeable with plan.  Final Clinical Impressions(s) / UC Diagnoses   Final diagnoses:  Nausea vomiting and diarrhea  Gastroenteritis  Food poisoning  Encounter for laboratory testing for COVID-19 virus  Hypokalemia     Discharge Instructions      You likely have either food poisoning or stomach virus.  Please take prescribed ondansetron as needed for nausea.  Also increase clear fluids to prevent dehydration.  Please to the hospital if abdominal pain or symptoms significantly worsen.  Your COVID-19 testing and urine culture pending.  We will call if these are positive.     ED Prescriptions     Medication Sig Dispense Auth. Provider   ondansetron (ZOFRAN) 4 MG tablet Take 1  tablet (4 mg total) by mouth every 6 (six) hours as needed for nausea or vomiting. 12 tablet Phillips Odor E, FNP   potassium chloride (KLOR-CON) 10 MEQ tablet Take 1 tablet (10 mEq total) by mouth daily. 4 tablet Odis Luster, FNP      PDMP not reviewed this encounter.   Odis Luster, FNP 06/15/21 [place], [place], FNP 06/15/21 1945

## 2021-06-15 NOTE — Discharge Instructions (Addendum)
You likely have either food poisoning or stomach virus.  Please take prescribed ondansetron as needed for nausea.  Also increase clear fluids to prevent dehydration.  Please to the hospital if abdominal pain or symptoms significantly worsen.  Your COVID-19 testing and urine culture pending.  We will call if these are positive.

## 2021-06-15 NOTE — ED Triage Notes (Signed)
Pt reports HA,Nausea and vomiting with ABD pain.

## 2021-06-16 LAB — SARS CORONAVIRUS 2 (TAT 6-24 HRS): SARS Coronavirus 2: NEGATIVE

## 2021-06-17 ENCOUNTER — Ambulatory Visit: Payer: 59 | Admitting: Pharmacist

## 2021-06-17 LAB — URINE CULTURE

## 2021-06-24 ENCOUNTER — Telehealth: Payer: Self-pay | Admitting: *Deleted

## 2021-06-24 NOTE — Telephone Encounter (Signed)
Unable to reach patient for virtual pre visit. Message left requesting patient call to re-schedule to avoid cancellation of up-coming colonoscopy.

## 2021-06-29 ENCOUNTER — Other Ambulatory Visit: Payer: Self-pay

## 2021-06-29 ENCOUNTER — Telehealth: Payer: Self-pay | Admitting: Nurse Practitioner

## 2021-06-29 ENCOUNTER — Ambulatory Visit: Payer: 59 | Attending: Nurse Practitioner | Admitting: Nurse Practitioner

## 2021-06-29 ENCOUNTER — Encounter: Payer: Self-pay | Admitting: Nurse Practitioner

## 2021-06-29 DIAGNOSIS — I10 Essential (primary) hypertension: Secondary | ICD-10-CM | POA: Diagnosis not present

## 2021-06-29 DIAGNOSIS — E785 Hyperlipidemia, unspecified: Secondary | ICD-10-CM | POA: Diagnosis not present

## 2021-06-29 DIAGNOSIS — E876 Hypokalemia: Secondary | ICD-10-CM

## 2021-06-29 DIAGNOSIS — E1165 Type 2 diabetes mellitus with hyperglycemia: Secondary | ICD-10-CM

## 2021-06-29 DIAGNOSIS — Z794 Long term (current) use of insulin: Secondary | ICD-10-CM

## 2021-06-29 NOTE — Telephone Encounter (Signed)
No answer. LVM with assistance from interpreter carla [phone]

## 2021-06-29 NOTE — Progress Notes (Signed)
Virtual Visit via Telephone Note Due to national recommendations of social distancing due to COVID 19, telehealth visit is felt to be most appropriate for this patient at this time.  I discussed the limitations, risks, security and privacy concerns of performing an evaluation and management service by telephone and the availability of in person appointments. I also discussed with the patient that there may be a patient responsible charge related to this service. The patient expressed understanding and agreed to proceed.    I connected with Kathleen Stout on 06/29/21  at   3:50 PM EDT  EDT by telephone and verified that I am speaking with the correct person using two identifiers.  Location of Patient: Private Residence   Location of Provider: [facility]    Persons participating in Telemedicine visit: Bertram Denver FNP-BC [place]    History of Present Illness: Telemedicine visit for: Follow up to DM She has a past medical history of Anemia, Arthritis, Essential hypertension, Morbid obesity, and Type II diabetes mellitus.   DM Poorly controlled. She can not recall how much trulicity she is administering. Fasting AM reading 220. She is administering 36 units of lantus daily, taking metformin XR 500 mg 2 tablets BID. LDL not quire at [place] with simvastatin 20 mg daily.  Lab Results  Component Value Date   HGBA1C 13.4 (A) 04/12/2021    Lab Results  Component Value Date   LDLCALC 72 05/20/2021    HTN Well controlled with amlodipine 10 mg daily and lisinopril-hctz 20-25 mg daily. Denies chest pain, shortness of breath, palpitations, lightheadedness, dizziness, headaches or BLE edema.   BP Readings from Last 3 Encounters:  06/15/21 128/84  06/15/21 (!) 154/103  05/20/21 127/85     Past Medical History:  Diagnosis Date   Anemia    Arthritis    Chest pressure    a. 08/2016 Myoview: EF 68%, no ST changes, basal anterior/mid anterior defect - felt  to be attenuation-->Low risk.   Essential hypertension    Morbid obesity (HCC)    Type II diabetes mellitus (H[facility])     Past Surgical History:  Procedure Laterality Date   ABDOMINAL HYSTERECTOMY      Family History  Problem Relation Age of Onset   Diabetes Mother    Hypertension Mother    Hyperlipidemia Mother    Heart disease Mother    Aneurysm Mother 41   Healthy Sister    Healthy Brother    Healthy Sister    Healthy Sister    Healthy Sister    Diabetes Brother    Hypertension Brother    Healthy Brother    Healthy Brother    Healthy Brother    Healthy Brother    Healthy Brother    Healthy Brother    Healthy Brother    Healthy Brother     Social History   Socioeconomic History   Marital status: Single    Spouse name: Not on file   Number of children: Not on file   Years of education: Not on file   Highest education level: Not on file  Occupational History   Occupation: Housekeeper  Tobacco Use   Smoking status: Never   Smokeless tobacco: Never  Vaping Use   Vaping Use: Never used  Substance and Sexual Activity   Alcohol use: No   Drug use: No   Sexual activity: Not on file  Other Topics Concern   Not on file  Social History Narrative   Does  not routinely exercise.  Regularly eats fast food, especially [employer] and [place] food.  Originally from [place], [place] - moved to [place] in 2015.   Social Determinants of Health   Financial Resource Strain: Not on file  Food Insecurity: Not on file  Transportation Needs: Not on file  Physical Activity: Not on file  Stress: Not on file  Social Connections: Not on file     Observations/Objective: Awake, alert and oriented x 3   Review of Systems  Constitutional:  Negative for fever, malaise/fatigue and weight loss.  HENT: Negative.  Negative for nosebleeds.   Eyes: Negative.  Negative for blurred vision, double vision and photophobia.  Respiratory: Negative.  Negative for cough and shortness of breath.    Cardiovascular: Negative.  Negative for chest pain, palpitations and leg swelling.  Gastrointestinal: Negative.  Negative for heartburn, nausea and vomiting.  Musculoskeletal: Negative.  Negative for myalgias.  Neurological: Negative.  Negative for dizziness, focal weakness, seizures and headaches.  Psychiatric/Behavioral: Negative.  Negative for suicidal ideas.    Assessment and Plan: Diagnoses and all orders for this visit:  Type 2 diabetes mellitus with hyperglycemia, with long-term current use of insulin (HCC) -     Ambulatory referral to Ophthalmology Continue blood sugar control as discussed in office today, low carbohydrate diet, and regular physical exercise as tolerated, 150 minutes per week (30 min each day, 5 days per week, or 50 min 3 days per week). Keep blood sugar logs with fasting goal of 90-130 mg/dl, post prandial (after you eat) less than 180.  For Hypoglycemia: BS <60 and Hyperglycemia BS >400; contact the clinic ASAP. Annual eye exams and foot exams are recommended.   Hypokalemia -     Basic metabolic panel; Future  Dyslipidemia, goal LDL below 70 INSTRUCTIONS: Work on a low fat, heart healthy diet and participate in regular aerobic exercise program by working out at least 150 minutes per week; 5 days a week-30 minutes per day. Avoid red meat/beef/steak,  fried foods. junk foods, sodas, sugary drinks, unhealthy snacking, alcohol and smoking.  Drink at least 80 oz of water per day and monitor your carbohydrate intake daily.    Primary hypertension Continue all antihypertensives as prescribed.  Remember to bring in your blood pressure log with you for your follow up appointment.  DASH/Mediterranean Diets are healthier choices for HTN.      Follow Up Instructions Return for a1c 2 weeks. See me in November.     I discussed the assessment and treatment plan with the patient. The patient was provided an opportunity to ask questions and all were answered. The patient  agreed with the plan and demonstrated an understanding of the instructions.   The patient was advised to call back or seek an in-person evaluation if the symptoms worsen or if the condition fails to improve as anticipated.  I provided 16 minutes of non-face-to-face time during this encounter including median intraservice time, reviewing previous notes, labs, imaging, medications and explaining diagnosis and management.  Claiborne Rigg, FNP-BC

## 2021-07-08 ENCOUNTER — Ambulatory Visit: Payer: 59 | Admitting: Nurse Practitioner

## 2021-07-08 ENCOUNTER — Encounter: Payer: 59 | Admitting: Gastroenterology

## 2021-07-12 ENCOUNTER — Other Ambulatory Visit: Payer: Self-pay | Admitting: Nurse Practitioner

## 2021-07-12 DIAGNOSIS — Z09 Encounter for follow-up examination after completed treatment for conditions other than malignant neoplasm: Secondary | ICD-10-CM

## 2021-11-19 ENCOUNTER — Other Ambulatory Visit: Payer: Self-pay

## 2021-11-19 ENCOUNTER — Encounter (HOSPITAL_COMMUNITY): Payer: Self-pay | Admitting: Emergency Medicine

## 2021-11-19 ENCOUNTER — Ambulatory Visit (HOSPITAL_COMMUNITY)
Admission: EM | Admit: 2021-11-19 | Discharge: 2021-11-19 | Disposition: A | Discharge status: Home / Self Care | Payer: 59 | Attending: Internal Medicine | Admitting: Internal Medicine

## 2021-11-19 DIAGNOSIS — M542 Cervicalgia: Secondary | ICD-10-CM | POA: Diagnosis not present

## 2021-11-19 DIAGNOSIS — M62838 Other muscle spasm: Secondary | ICD-10-CM | POA: Diagnosis not present

## 2021-11-19 DIAGNOSIS — M5010 Cervical disc disorder with radiculopathy, unspecified cervical region: Secondary | ICD-10-CM | POA: Diagnosis not present

## 2021-11-19 MED ORDER — ACETAMINOPHEN 325 MG PO TABS
975.0000 mg | ORAL_TABLET | Freq: Once | ORAL | Status: AC
  Administered 2021-11-19: 11:00:00 975 mg via ORAL

## 2021-11-19 MED ORDER — ACETAMINOPHEN 325 MG PO TABS
ORAL_TABLET | ORAL | Status: AC
  Filled 2021-11-19: qty 3

## 2021-11-19 MED ORDER — KETOROLAC TROMETHAMINE 10 MG PO TABS: 10.0000 mg | ORAL_TABLET | Freq: Four times a day (QID) | ORAL | 0 refills | Status: DC | PRN

## 2021-11-19 MED ORDER — CYCLOBENZAPRINE HCL 10 MG PO TABS: 10.0000 mg | ORAL_TABLET | Freq: Three times a day (TID) | ORAL | 0 refills | Status: DC

## 2021-11-19 NOTE — ED Triage Notes (Signed)
Pt reports since yesterday having left neck pain that radiates down left arm.

## 2021-11-19 NOTE — ED Provider Notes (Signed)
[facility]    CSN: [id number] Arrival date & time: 11/19/21  1003      History   Chief Complaint Chief Complaint  Patient presents with   Neck Pain   Arm Pain    HPI Kathleen Stout is a 50 y.o. female who presents with L neck pain radiating to her arm since yesterday. She denies injuring her self or doing repetitive work with L arm above her head. 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 Denies paresthesia of L arm, but feels she has to force her L hand to open when she has it in a fist. Denies hx of neck disc issues. She has taken Ibuprofen 800 mg this am, and been taking this and tylenol without relief.     Past Medical History:  Diagnosis Date   Anemia    Arthritis    Chest pressure    a. 08/2016 Myoview: EF 68%, no ST changes, basal anterior/mid anterior defect - felt to be attenuation-->Low risk.   Essential hypertension    Morbid obesity ([place])    Type II diabetes mellitus ([place])     Patient Active Problem List   Diagnosis Date Noted   Wheezing 11/25/2018   Influenza B 11/25/2018   Pneumonia due to infectious organism 11/25/2018   Depression, recurrent ([place]) 05/29/2018   Chest pressure    Chest pain 09/19/2016   Morbid obesity ([place])    Type II diabetes mellitus ([place])    Morbid obesity with BMI of 50.0-59.9, adult ([place]) 08/02/2015   Diabetes mellitus type 2 in obese ([place]) 08/02/2015   Essential hypertension 05/21/2015   Osteoarthritis of left knee 05/21/2015   Knee pain, left 04/20/2015    Past Surgical History:  Procedure Laterality Date   ABDOMINAL HYSTERECTOMY      OB History   No obstetric history on file.      Home Medications    Prior to Admission medications   Medication Sig Start Date End Date Taking? Authorizing Provider  cyclobenzaprine (FLEXERIL) 10 MG tablet Take 1 tablet (10 mg total) by mouth 3 (three) times daily. 11/19/21  Yes Rodriguez-Southworth, Sunday Spillers, PA-C  ketorolac (TORADOL) 10 MG tablet  Take 1 tablet (10 mg total) by mouth every 6 (six) hours as needed. 11/19/21  Yes Rodriguez-Southworth, Sunday Spillers, PA-C  Accu-Chek Softclix Lancets lancets Use as instructed. Inject into the skin twice daily/ ICD 10 E11.65 04/12/21   Gildardo Pounds, NP  amLODipine (NORVASC) 10 MG tablet Take 1 tablet (10 mg total) by mouth daily. 04/12/21   Gildardo Pounds, NP  Blood Glucose Monitoring Suppl (ONETOUCH VERIO) w/Device KIT Please provide patient with insurance approved GLUCOMETER ICD 10 E11.65 04/12/21   Gildardo Pounds, NP  Dulaglutide (TRULICITY) 1.5 LD/3.5TS SOPN Inject 1.5 mg into the skin once a week. 05/20/21   Charlott Rakes, MD  gabapentin (NEURONTIN) 300 MG capsule Take 1 capsule (300 mg total) by mouth 3 (three) times daily. 06/10/21   Gildardo Pounds, NP  glucose blood test strip Use as instructed. Inject into the skin twice daily. E11.65 04/12/21   Gildardo Pounds, NP  insulin glargine (LANTUS) 100 UNIT/ML Solostar Pen Inject 36 Units into the skin daily at 10 pm. 05/20/21 06/19/21  Charlott Rakes, MD  Insulin Pen Needle (B-D UF III MINI PEN NEEDLES) 31G X 5 MM MISC Use as instructed. Inject into the skin once nightly. 04/12/21   Gildardo Pounds, NP  lisinopril-hydrochlorothiazide (ZESTORETIC) 20-25 MG tablet Take  1 tablet by mouth daily. 04/12/21   Gildardo Pounds, NP  metFORMIN (GLUCOPHAGE-XR) 500 MG 24 hr tablet Take 2 tablets by mouth after your largest meal x1 week. Then, increase to 2 tablets in the morning and 2 in the evening. 05/06/21   Charlott Rakes, MD  ondansetron (ZOFRAN) 4 MG tablet Take 1 tablet (4 mg total) by mouth every 6 (six) hours as needed for nausea or vomiting. 06/15/21   Teodora Medici, FNP  potassium chloride (KLOR-CON) 10 MEQ tablet Take 1 tablet (10 mEq total) by mouth daily. 06/15/21   Teodora Medici, FNP  simvastatin (ZOCOR) 20 MG tablet Take 1 tablet (20 mg total) by mouth at bedtime. 04/12/21 07/11/21  Gildardo Pounds, NP    Family History Family History   Problem Relation Age of Onset   Diabetes Mother    Hypertension Mother    Hyperlipidemia Mother    Heart disease Mother    Aneurysm Mother 48   Healthy Sister    Healthy Brother    Healthy Sister    Healthy Sister    Healthy Sister    Diabetes Brother    Hypertension Brother    Healthy Brother    Healthy Brother    Healthy Brother    Healthy Brother    Healthy Brother    Healthy Brother    Healthy Brother    Healthy Brother     Social History Social History   Tobacco Use   Smoking status: Never   Smokeless tobacco: Never  Vaping Use   Vaping Use: Never used  Substance Use Topics   Alcohol use: No   Drug use: No     Allergies   Patient has no known allergies.   Review of Systems Review of Systems  Constitutional:  Negative for fever.  HENT:  Negative for congestion and rhinorrhea.   Respiratory:  Negative for cough.   Musculoskeletal:  Positive for arthralgias, neck pain and neck stiffness.  Skin:  Negative for rash.  Neurological:  Positive for weakness. Negative for numbness.    Physical Exam Triage Vital Signs ED Triage Vitals [11/19/21 1036]  Enc Vitals Group     BP (!) 212/99     Pulse Rate 72     Resp 20     Temp 98.2 F (36.8 C)     Temp src      SpO2 100 %     Weight      Height      Head Circumference      Peak Flow      Pain Score 10     Pain Loc      Pain Edu?      Excl. in [place]?    No data found.  Updated Vital Signs BP (!) 212/99 (BP Location: Right Arm)    Pulse 72    Temp 98.2 F (36.8 C)    Resp 20    SpO2 100%   Visual Acuity Right Eye Distance:   Left Eye Distance:   Bilateral Distance:    Right Eye Near:   Left Eye Near:    Bilateral Near:     Physical Exam Constitutional:      General: She is in acute distress.     Appearance: She is obese.     Comments: Who seems in severe pain  HENT:     Right Ear: External ear normal.     Left Ear: External ear normal.  Eyes:  General: No scleral icterus.     Conjunctiva/sclera: Conjunctivae normal.  Cardiovascular:     Comments: L trapezius and rhomboid are tense and very tender. ROM of neck with L rotation and R lateral flexion provoked more pain.  Pulmonary:     Effort: Pulmonary effort is normal.  Musculoskeletal:     Cervical back: Neck supple. Tenderness present.  Lymphadenopathy:     Cervical: No cervical adenopathy.  Skin:    General: Skin is warm and dry.  Neurological:     Mental Status: She is alert and oriented to person, place, and time.     Gait: Gait normal.     Deep Tendon Reflexes: Reflexes normal.     Comments: Finger grip with L hand 3/5, R hand 5/5. L forearm and L upper arm strength  4/5  Psychiatric:        Mood and Affect: Mood normal.        Thought Content: Thought content normal.        Judgment: Judgment normal.     UC Treatments / Results  Labs (all labs ordered are listed, but only abnormal results are displayed) Labs Reviewed - No data to display  EKG   Radiology No results found.  Procedures Procedures (including critical care time)  Medications Ordered in UC Medications  acetaminophen (TYLENOL) tablet 975 mg (975 mg Oral Given 11/19/21 1104)    Initial Impression / Assessment and Plan / UC Course  I have reviewed the triage vital signs and the nursing notes. Cervicalgia with radicular pain She was given Tylenol 1000 mg here, and I sent rx of Flexeril as noted. If she is not better in 24h needs to go to ER since she may need her neck scanned. She and husband agreed.      Final Clinical Impressions(s) / UC Diagnoses   Final diagnoses:  Cervicalgia  Trapezius muscle spasm  Cervical disc disorder with radiculopathy of cervical region     Discharge Instructions      Go to the ER if you get worse in the next 24 hours      ED Prescriptions     Medication Sig Dispense Auth. Provider   cyclobenzaprine (FLEXERIL) 10 MG tablet Take 1 tablet (10 mg total) by mouth 3 (three) times  daily. 30 tablet Rodriguez-Southworth, Sunday Spillers, PA-C   ketorolac (TORADOL) 10 MG tablet Take 1 tablet (10 mg total) by mouth every 6 (six) hours as needed. 20 tablet Rodriguez-Southworth, Sunday Spillers, PA-C      PDMP not reviewed this encounter.   Shelby Mattocks, PA-C 11/19/21 1109

## 2021-11-19 NOTE — Discharge Instructions (Signed)
Go to the ER if you get worse in the next 24 hours

## 2022-01-18 ENCOUNTER — Ambulatory Visit: Payer: Self-pay | Admitting: Family Medicine

## 2022-05-20 IMAGING — CR DG CHEST 2V
2 series · 2 of 2 positions shown · non-contrast
Comparison: 11/23/2018

CLINICAL DATA: Shortness of breath

EXAM:
CHEST - 2 VIEW

[w chest pa]
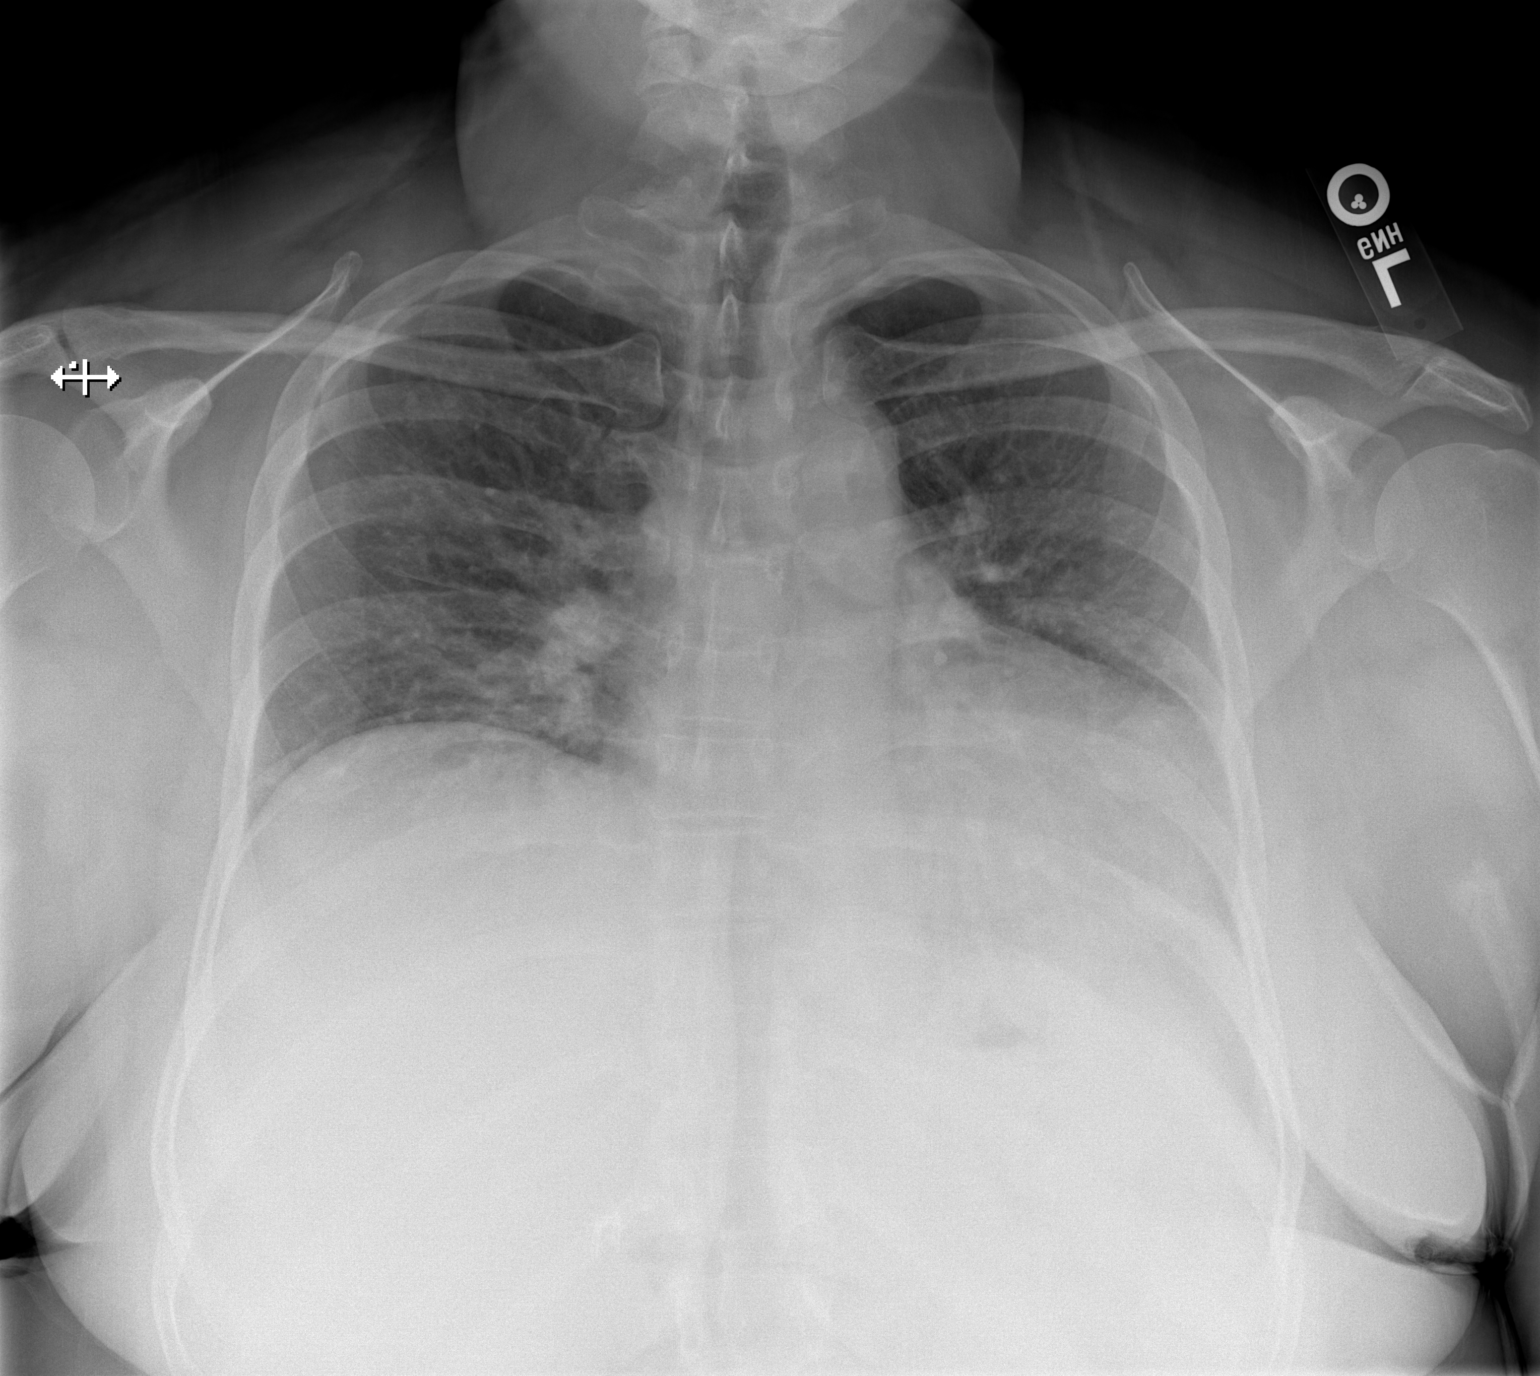

[w chest lat]
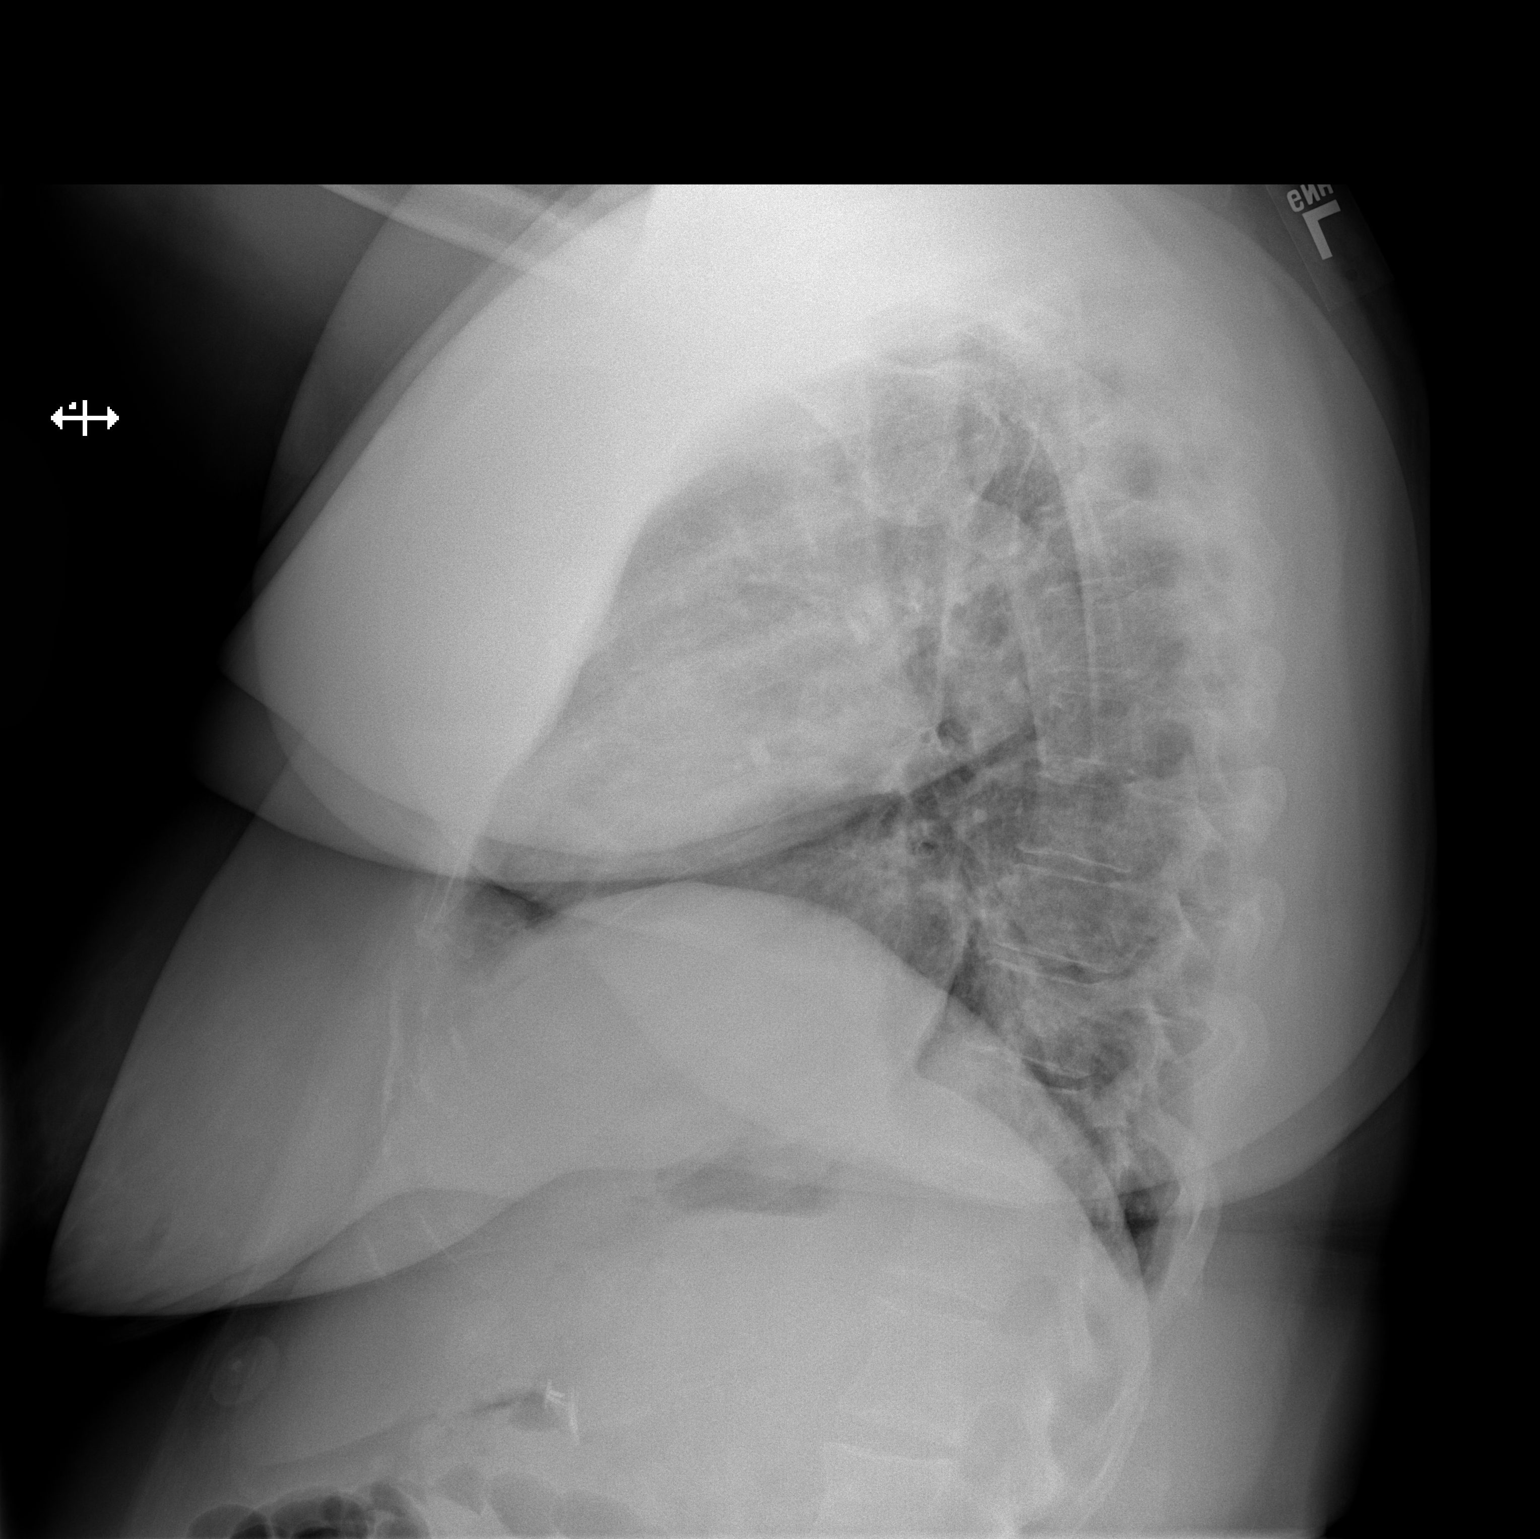

[2 of 2 positions shown; findings below may reference images not displayed]

FINDINGS: Cardiac shadow is mildly prominent but stable. Elevation of the
right hemidiaphragm is noted and stable. Some increased density is
noted posteriorly on the lateral projection likely related to right
lower lobe infiltrate. No bony abnormality is noted.
IMPRESSION: Increased density suggestive of right lower lobe infiltrate.

## 2022-07-21 ENCOUNTER — Ambulatory Visit
Admission: EM | Admit: 2022-07-21 | Discharge: 2022-07-21 | Disposition: A | Discharge status: Home / Self Care | Payer: Commercial Managed Care - HMO | Attending: Physician Assistant | Admitting: Physician Assistant

## 2022-07-21 DIAGNOSIS — R051 Acute cough: Secondary | ICD-10-CM | POA: Diagnosis not present

## 2022-07-21 DIAGNOSIS — Z7951 Long term (current) use of inhaled steroids: Secondary | ICD-10-CM | POA: Insufficient documentation

## 2022-07-21 DIAGNOSIS — I10 Essential (primary) hypertension: Secondary | ICD-10-CM | POA: Insufficient documentation

## 2022-07-21 DIAGNOSIS — Z20822 Contact with and (suspected) exposure to covid-19: Secondary | ICD-10-CM | POA: Insufficient documentation

## 2022-07-21 DIAGNOSIS — E119 Type 2 diabetes mellitus without complications: Secondary | ICD-10-CM | POA: Diagnosis not present

## 2022-07-21 DIAGNOSIS — Z6841 Body Mass Index (BMI) 40.0 and over, adult: Secondary | ICD-10-CM | POA: Insufficient documentation

## 2022-07-21 DIAGNOSIS — J9801 Acute bronchospasm: Secondary | ICD-10-CM | POA: Diagnosis not present

## 2022-07-21 DIAGNOSIS — J069 Acute upper respiratory infection, unspecified: Secondary | ICD-10-CM | POA: Diagnosis not present

## 2022-07-21 MED ORDER — BENZONATATE 100 MG PO CAPS: 100.0000 mg | ORAL_CAPSULE | Freq: Three times a day (TID) | ORAL | 0 refills | Status: DC

## 2022-07-21 MED ORDER — BUDESONIDE-FORMOTEROL FUMARATE 80-4.5 MCG/ACT IN AERO: 2.0000 | INHALATION_SPRAY | Freq: Two times a day (BID) | RESPIRATORY_TRACT | 1 refills | Status: DC

## 2022-07-21 MED ORDER — FLUTICASONE-SALMETEROL 115-21 MCG/ACT IN AERO: 2.0000 | INHALATION_SPRAY | Freq: Two times a day (BID) | RESPIRATORY_TRACT | 1 refills | Status: DC

## 2022-07-21 MED ORDER — ALBUTEROL SULFATE (2.5 MG/3ML) 0.083% IN NEBU: 2.5000 mg | INHALATION_SOLUTION | Freq: Once | RESPIRATORY_TRACT | Status: DC

## 2022-07-21 MED ORDER — ALBUTEROL SULFATE HFA 108 (90 BASE) MCG/ACT IN AERS
2.0000 | INHALATION_SPRAY | Freq: Once | RESPIRATORY_TRACT | Status: DC
Start: 2022-07-21 — End: 2022-07-21

## 2022-07-21 NOTE — Discharge Instructions (Signed)
Monitor your MyChart for your COVID results.  We will contact you if they are positive.  If you are positive you would benefit from Paxlovid (antiviral medication).  If you take this medication you need to hold your amlodipine.  You also need to hold your simvastatin and wait 3 days after completing course of Paxlovid before restarting simvastatin.  Use Symbicort twice daily.  Make sure to rinse your mouth following use of this medication to prevent thrush.  This can raise your blood sugar.  Monitor your blood sugar closely.  Make sure you are drinking plenty of fluids.  I do recommend you follow-up closely with your primary care.  If you have any worsening symptoms including worsening cough, shortness of breath, chest pain, nausea, vomiting you need to go to the emergency room immediately.

## 2022-07-21 NOTE — ED Triage Notes (Signed)
Pt c/o body aches, chest congestion, headache, onset ~ last night.

## 2022-07-21 NOTE — ED Provider Notes (Addendum)
[facility]    CSN: [id number] Arrival date & time: 07/21/22  1108      History   Chief Complaint Chief Complaint  Patient presents with   Generalized Body Aches    HPI Kathleen Stout is a 51 y.o. female.   Patient presents today with a 1 day history of URI symptoms.  She reports body aches, sore throat, cough, headache, fatigue, malaise.  She denies any fever, nausea, vomiting, diarrhea.  She reports of chest tightness but denies any significant pain/shortness of breath.  She does report household sick contacts with similar symptoms.  She is unsure what they were ultimately diagnosed with as they were told that it was a respiratory virus.  She denies history of COPD or asthma.  Does report that she often gets an albuterol inhaler when she is sick but denies formal diagnosis of this condition.  She does have history of diabetes, obesity, hypertension.  She denies history of smoking.  Denies any recent antibiotic or steroid use.  She has not had COVID in the past but has had COVID-19 vaccines.  She is having difficulty with daily duties as result of symptoms.    Past Medical History:  Diagnosis Date   Anemia    Arthritis    Chest pressure    a. 08/2016 Myoview: EF 68%, no ST changes, basal anterior/mid anterior defect - felt to be attenuation-->Low risk.   Essential hypertension    Morbid obesity ([place])    Type II diabetes mellitus ([place])     Patient Active Problem List   Diagnosis Date Noted   Wheezing 11/25/2018   Influenza B 11/25/2018   Pneumonia due to infectious organism 11/25/2018   Depression, recurrent ([place]) 05/29/2018   Chest pressure    Chest pain 09/19/2016   Morbid obesity ([place])    Type II diabetes mellitus ([place])    Morbid obesity with BMI of 50.0-59.9, adult ([place]) 08/02/2015   Diabetes mellitus type 2 in obese ([place]) 08/02/2015   Essential hypertension 05/21/2015   Osteoarthritis of left knee 05/21/2015   Knee pain, left 04/20/2015    Past  Surgical History:  Procedure Laterality Date   ABDOMINAL HYSTERECTOMY      OB History   No obstetric history on file.      Home Medications    Prior to Admission medications   Medication Sig Start Date End Date Taking? Authorizing Provider  benzonatate (TESSALON) 100 MG capsule Take 1 capsule (100 mg total) by mouth every 8 (eight) hours. 07/21/22  Yes Kolbe Delmonaco, Junie Panning K, PA-C  budesonide-formoterol (SYMBICORT) 80-4.5 MCG/ACT inhaler Inhale 2 puffs into the lungs in the morning and at bedtime. 07/21/22  Yes Shaquaya Wuellner K, PA-C  Accu-Chek Softclix Lancets lancets Use as instructed. Inject into the skin twice daily/ ICD 10 E11.65 04/12/21   Gildardo Pounds, NP  amLODipine (NORVASC) 10 MG tablet Take 1 tablet (10 mg total) by mouth daily. 04/12/21   Gildardo Pounds, NP  Blood Glucose Monitoring Suppl (ONETOUCH VERIO) w/Device KIT Please provide patient with insurance approved GLUCOMETER ICD 10 E11.65 04/12/21   Gildardo Pounds, NP  Dulaglutide (TRULICITY) 1.5 KN/3.9JQ SOPN Inject 1.5 mg into the skin once a week. 05/20/21   Charlott Rakes, MD  glucose blood test strip Use as instructed. Inject into the skin twice daily. E11.65 04/12/21   Gildardo Pounds, NP  Insulin Pen Needle (B-D UF III MINI PEN NEEDLES) 31G X 5 MM MISC Use as instructed. Inject into the skin  once nightly. 04/12/21   Gildardo Pounds, NP  ketorolac (TORADOL) 10 MG tablet Take 1 tablet (10 mg total) by mouth every 6 (six) hours as needed. 11/19/21   Rodriguez-Southworth, Sunday Spillers, PA-C  lisinopril-hydrochlorothiazide (ZESTORETIC) 20-25 MG tablet Take 1 tablet by mouth daily. 04/12/21   Gildardo Pounds, NP  metFORMIN (GLUCOPHAGE-XR) 500 MG 24 hr tablet Take 2 tablets by mouth after your largest meal x1 week. Then, increase to 2 tablets in the morning and 2 in the evening. 05/06/21   Charlott Rakes, MD  ondansetron (ZOFRAN) 4 MG tablet Take 1 tablet (4 mg total) by mouth every 6 (six) hours as needed for nausea or vomiting. 06/15/21    Teodora Medici, FNP  simvastatin (ZOCOR) 20 MG tablet Take 1 tablet (20 mg total) by mouth at bedtime. 04/12/21 07/11/21  Gildardo Pounds, NP    Family History Family History  Problem Relation Age of Onset   Diabetes Mother    Hypertension Mother    Hyperlipidemia Mother    Heart disease Mother    Aneurysm Mother 76   Healthy Sister    Healthy Brother    Healthy Sister    Healthy Sister    Healthy Sister    Diabetes Brother    Hypertension Brother    Healthy Brother    Healthy Brother    Healthy Brother    Healthy Brother    Healthy Brother    Healthy Brother    Healthy Brother    Healthy Brother     Social History Social History   Tobacco Use   Smoking status: Never   Smokeless tobacco: Never  Vaping Use   Vaping Use: Never used  Substance Use Topics   Alcohol use: No   Drug use: No     Allergies   Patient has no known allergies.   Review of Systems Review of Systems  Constitutional:  Positive for activity change and fatigue. Negative for appetite change and fever.  HENT:  Positive for congestion and sore throat. Negative for sinus pressure and sneezing.   Respiratory:  Positive for cough and chest tightness. Negative for shortness of breath.   Cardiovascular:  Negative for chest pain.  Gastrointestinal:  Negative for abdominal pain, diarrhea, nausea and vomiting.  Neurological:  Positive for headaches. Negative for dizziness and light-headedness.     Physical Exam Triage Vital Signs ED Triage Vitals  Enc Vitals Group     BP 07/21/22 1226 (!) 174/128     Pulse Rate 07/21/22 1226 80     Resp 07/21/22 1226 18     Temp 07/21/22 1226 98 F (36.7 C)     Temp Source 07/21/22 1226 Oral     SpO2 07/21/22 1226 95 %     Weight --      Height --      Head Circumference --      Peak Flow --      Pain Score 07/21/22 1224 6     Pain Loc --      Pain Edu? --      Excl. in [place]? --    No data found.  Updated Vital Signs BP (!) 174/128 (BP Location: Left  Arm)   Pulse 80   Temp 98 F (36.7 C) (Oral)   Resp 18   SpO2 95% Comment: blue nail polish  Visual Acuity Right Eye Distance:   Left Eye Distance:   Bilateral Distance:    Right Eye Near:   Left  Eye Near:    Bilateral Near:     Physical Exam Vitals reviewed.  Constitutional:      General: She is awake. She is not in acute distress.    Appearance: Normal appearance. She is well-developed. She is not ill-appearing.     Comments: Very pleasant female appears stated age in no acute distress  HENT:     Head: Normocephalic and atraumatic.     Right Ear: Tympanic membrane, ear canal and external ear normal. Tympanic membrane is not erythematous or bulging.     Left Ear: Tympanic membrane, ear canal and external ear normal. Tympanic membrane is not erythematous or bulging.     Nose:     Right Sinus: No maxillary sinus tenderness or frontal sinus tenderness.     Left Sinus: No maxillary sinus tenderness or frontal sinus tenderness.     Mouth/Throat:     Pharynx: Uvula midline. Posterior oropharyngeal erythema present. No oropharyngeal exudate.  Cardiovascular:     Rate and Rhythm: Normal rate and regular rhythm.     Heart sounds: Normal heart sounds, S1 normal and S2 normal. No murmur heard. Pulmonary:     Effort: Pulmonary effort is normal.     Breath sounds: Normal breath sounds. No wheezing, rhonchi or rales.     Comments: Reactive cough with deep breathing Psychiatric:        Behavior: Behavior is cooperative.      UC Treatments / Results  Labs (all labs ordered are listed, but only abnormal results are displayed) Labs Reviewed  SARS CORONAVIRUS 2 (TAT 6-24 HRS)    EKG   Radiology No results found.  Procedures Procedures (including critical care time)  Medications Ordered in UC Medications  albuterol (VENTOLIN HFA) 108 (90 Base) MCG/ACT inhaler 2 puff (has no administration in time range)  albuterol (PROVENTIL) (2.5 MG/3ML) 0.083% nebulizer solution 2.5 mg  (has no administration in time range)    Initial Impression / Assessment and Plan / UC Course  I have reviewed the triage vital signs and the nursing notes.  Pertinent labs & imaging results that were available during my care of the patient were reviewed by me and considered in my medical decision making (see chart for details).     Patient had significant improvement of symptoms with albuterol treatment in clinic today.  Suspect URI has triggered bronchospasms.  Given short duration of symptoms will test for COVID-19.  Given patient's history of diabetes, obesity, hypertension she is a candidate for antiviral therapy.  Her last CMP obtained 06/21/2022 showed EGFR of greater than 80 mL/min.  If she requires Paxlovid prescription she would need to hold her amlodipine while on the medication.  She would also need to hold her simvastatin while on the medication for 3 days after completing course.  She can use over-the-counter medication including Tylenol, Mucinex, Flonase.  She was given albuterol inhaler for shortness of breath and wheezing.  She does have a history of diabetes that is not well controlled so we will use Symbicort in the hopes that this does not raise her blood sugar significantly.  She is to monitor her blood sugar closely and make sure that she is drinking plenty of fluid.  Discussed that she should rinse her mouth following use of this medication to prevent thrush.  Recommended she follow-up with her primary care provider soon as possible.  If she has any worsening symptoms she needs to be seen immediately including chest pain, shortness of breath, fever, nausea, vomiting.  Strict return precautions given to which she expressed understanding.  Patient return to clinic indicating that Symbicort was not covered by her insurance.  Advair was sent in hopes that this would be covered.  She will contact us if she has any difficulty getting medication.  Final Clinical Impressions(s) / UC  Diagnoses   Final diagnoses:  Upper respiratory tract infection, unspecified type  Acute cough  Bronchospasm     Discharge Instructions      Monitor your MyChart for your COVID results.  We will contact you if they are positive.  If you are positive you would benefit from Paxlovid (antiviral medication).  If you take this medication you need to hold your amlodipine.  You also need to hold your simvastatin and wait 3 days after completing course of Paxlovid before restarting simvastatin.  Use Symbicort twice daily.  Make sure to rinse your mouth following use of this medication to prevent thrush.  This can raise your blood sugar.  Monitor your blood sugar closely.  Make sure you are drinking plenty of fluids.  I do recommend you follow-up closely with your primary care.  If you have any worsening symptoms including worsening cough, shortness of breath, chest pain, nausea, vomiting you need to go to the emergency room immediately.     ED Prescriptions     Medication Sig Dispense Auth. Provider   budesonide-formoterol (SYMBICORT) 80-4.5 MCG/ACT inhaler Inhale 2 puffs into the lungs in the morning and at bedtime. 1 each Keysi Oelkers K, PA-C   benzonatate (TESSALON) 100 MG capsule Take 1 capsule (100 mg total) by mouth every 8 (eight) hours. 21 capsule Zerina Hallinan K, PA-C      PDMP not reviewed this encounter.   Terrilee Croak, PA-C 07/21/22 1308    Keoni Havey, Derry Skill, PA-C 07/21/22 1431

## 2022-07-22 LAB — SARS CORONAVIRUS 2 (TAT 6-24 HRS): SARS Coronavirus 2: NEGATIVE

## 2022-09-11 ENCOUNTER — Emergency Department (HOSPITAL_COMMUNITY): Payer: Commercial Managed Care - HMO

## 2022-09-11 ENCOUNTER — Emergency Department (HOSPITAL_COMMUNITY)
Admission: EM | Admit: 2022-09-11 | Discharge: 2022-09-12 | Disposition: A | Discharge status: Home / Self Care | Payer: Commercial Managed Care - HMO | Attending: Emergency Medicine | Admitting: Emergency Medicine

## 2022-09-11 ENCOUNTER — Ambulatory Visit
Admission: EM | Admit: 2022-09-11 | Discharge: 2022-09-11 | Disposition: A | Discharge status: Home / Self Care | Payer: Commercial Managed Care - HMO | Attending: Internal Medicine | Admitting: Internal Medicine

## 2022-09-11 ENCOUNTER — Encounter (HOSPITAL_COMMUNITY): Payer: Self-pay | Admitting: Emergency Medicine

## 2022-09-11 DIAGNOSIS — Z79899 Other long term (current) drug therapy: Secondary | ICD-10-CM | POA: Insufficient documentation

## 2022-09-11 DIAGNOSIS — R7981 Abnormal blood-gas level: Secondary | ICD-10-CM

## 2022-09-11 DIAGNOSIS — I10 Essential (primary) hypertension: Secondary | ICD-10-CM | POA: Diagnosis not present

## 2022-09-11 DIAGNOSIS — Z794 Long term (current) use of insulin: Secondary | ICD-10-CM | POA: Diagnosis not present

## 2022-09-11 DIAGNOSIS — Z20822 Contact with and (suspected) exposure to covid-19: Secondary | ICD-10-CM | POA: Diagnosis not present

## 2022-09-11 DIAGNOSIS — R0602 Shortness of breath: Secondary | ICD-10-CM | POA: Diagnosis present

## 2022-09-11 DIAGNOSIS — J189 Pneumonia, unspecified organism: Secondary | ICD-10-CM

## 2022-09-11 DIAGNOSIS — J069 Acute upper respiratory infection, unspecified: Secondary | ICD-10-CM | POA: Diagnosis not present

## 2022-09-11 DIAGNOSIS — E1165 Type 2 diabetes mellitus with hyperglycemia: Secondary | ICD-10-CM | POA: Diagnosis not present

## 2022-09-11 DIAGNOSIS — J181 Lobar pneumonia, unspecified organism: Secondary | ICD-10-CM | POA: Diagnosis not present

## 2022-09-11 DIAGNOSIS — R739 Hyperglycemia, unspecified: Secondary | ICD-10-CM

## 2022-09-11 DIAGNOSIS — Z7984 Long term (current) use of oral hypoglycemic drugs: Secondary | ICD-10-CM | POA: Insufficient documentation

## 2022-09-11 MED ORDER — IPRATROPIUM-ALBUTEROL 0.5-2.5 (3) MG/3ML IN SOLN
3.0000 mL | Freq: Once | RESPIRATORY_TRACT | Status: AC
  Administered 2022-09-11: 3 mL via RESPIRATORY_TRACT

## 2022-09-11 NOTE — ED Provider Triage Note (Signed)
Emergency Medicine Provider Triage Evaluation Note  Kathleen Stout , a 51 y.o. female  was evaluated in triage.  Pt complains of cough since Wednesday.  Has had some production of yellow sputum.  Chills but no objective fevers.  Went to urgent care today and they gave her a breathing treatment and then sent her oxygen was still low so they sent her here.   Review of Systems  Positive: Cough, productive of yellow sputum, chills, some shortness of breath Negative: Objective fever  Physical Exam  BP (!) 190/103 (BP Location: Right Arm)   Pulse 98   Temp 97.8 F (36.6 C) (Oral)   Resp (!) 24   SpO2 92%  Gen:   Awake, no distress   Resp:  Normal effort  MSK:   Moves extremities without difficulty  Other:  Oxygen 96% on my exam.  Mild wheezing but overall lung sounds fairly clear.  RRR.  Resting comfortably  Medical Decision Making  Medically screening exam initiated at 4:10 PM.  Appropriate orders placed.  Kathleen Stout was informed that the remainder of the evaluation will be completed by another provider, this initial triage assessment does not replace that evaluation, and the importance of remaining in the ED until their evaluation is complete.  Swabs and x-ray from triage.  Patient reports a history of bronchitis and this feels similar   Rhae Hammock, PA-C 09/11/22 1612

## 2022-09-11 NOTE — ED Triage Notes (Signed)
Pt presents with cough, weakness, HA, chills x 5 days.  Took Ibuprofen at 0630 today. SOB when laying flat and on exertion. Went to work today and was sent home.

## 2022-09-11 NOTE — Discharge Instructions (Signed)
Patient sent to hospital via EMS.  

## 2022-09-11 NOTE — ED Provider Notes (Signed)
[facility]    CSN: [id number] Arrival date & time: 09/11/22  1303      History   Chief Complaint Chief Complaint  Patient presents with   Cough    HPI Kathleen Stout is a 51 y.o. female.   Patient presents with cough, generalized weakness, headache, chills, nasal congestion that started about 5 days ago.  Patient has taken ibuprofen for symptoms with minimal improvement.  Patient reports that she also been having some intermittent shortness of breath with exertion.  Denies history of asthma or COPD.  Patient denies any documented fevers at home.  Denies any known sick contacts.  Denies chest pain, dizziness, nausea, vomiting, diarrhea, abdominal pain.   Cough   Past Medical History:  Diagnosis Date   Anemia    Arthritis    Chest pressure    a. 08/2016 Myoview: EF 68%, no ST changes, basal anterior/mid anterior defect - felt to be attenuation-->Low risk.   Essential hypertension    Morbid obesity ([place])    Type II diabetes mellitus ([place])     Patient Active Problem List   Diagnosis Date Noted   Wheezing 11/25/2018   Influenza B 11/25/2018   Pneumonia due to infectious organism 11/25/2018   Depression, recurrent ([place]) 05/29/2018   Chest pressure    Chest pain 09/19/2016   Morbid obesity ([place])    Type II diabetes mellitus ([place])    Morbid obesity with BMI of 50.0-59.9, adult ([place]) 08/02/2015   Diabetes mellitus type 2 in obese ([place]) 08/02/2015   Essential hypertension 05/21/2015   Osteoarthritis of left knee 05/21/2015   Knee pain, left 04/20/2015    Past Surgical History:  Procedure Laterality Date   ABDOMINAL HYSTERECTOMY      OB History   No obstetric history on file.      Home Medications    Prior to Admission medications   Medication Sig Start Date End Date Taking? Authorizing Provider  Accu-Chek Softclix Lancets lancets Use as instructed. Inject into the skin twice daily/ ICD 10 E11.65 04/12/21   Gildardo Pounds, NP  amLODipine  (NORVASC) 10 MG tablet Take 1 tablet (10 mg total) by mouth daily. 04/12/21   Gildardo Pounds, NP  benzonatate (TESSALON) 100 MG capsule Take 1 capsule (100 mg total) by mouth every 8 (eight) hours. 07/21/22   Raspet, Derry Skill, PA-C  Blood Glucose Monitoring Suppl (ONETOUCH VERIO) w/Device KIT Please provide patient with insurance approved GLUCOMETER ICD 10 E11.65 04/12/21   Gildardo Pounds, NP  Dulaglutide (TRULICITY) 1.5 EL/9.5VU SOPN Inject 1.5 mg into the skin once a week. 05/20/21   Charlott Rakes, MD  fluticasone-salmeterol (ADVAIR HFA) 023-34 MCG/ACT inhaler Inhale 2 puffs into the lungs 2 (two) times daily. 07/21/22   Raspet, Erin K, PA-C  glucose blood test strip Use as instructed. Inject into the skin twice daily. E11.65 04/12/21   Gildardo Pounds, NP  Insulin Pen Needle (B-D UF III MINI PEN NEEDLES) 31G X 5 MM MISC Use as instructed. Inject into the skin once nightly. 04/12/21   Gildardo Pounds, NP  ketorolac (TORADOL) 10 MG tablet Take 1 tablet (10 mg total) by mouth every 6 (six) hours as needed. 11/19/21   Rodriguez-Southworth, Sunday Spillers, PA-C  lisinopril-hydrochlorothiazide (ZESTORETIC) 20-25 MG tablet Take 1 tablet by mouth daily. 04/12/21   Gildardo Pounds, NP  metFORMIN (GLUCOPHAGE-XR) 500 MG 24 hr tablet Take 2 tablets by mouth after your largest meal x1 week. Then, increase to 2 tablets in the  morning and 2 in the evening. 05/06/21   Charlott Rakes, MD  ondansetron (ZOFRAN) 4 MG tablet Take 1 tablet (4 mg total) by mouth every 6 (six) hours as needed for nausea or vomiting. 06/15/21   Teodora Medici, FNP  simvastatin (ZOCOR) 20 MG tablet Take 1 tablet (20 mg total) by mouth at bedtime. 04/12/21 07/11/21  Gildardo Pounds, NP    Family History Family History  Problem Relation Age of Onset   Diabetes Mother    Hypertension Mother    Hyperlipidemia Mother    Heart disease Mother    Aneurysm Mother 72   Healthy Sister    Healthy Brother    Healthy Sister    Healthy Sister    Healthy  Sister    Diabetes Brother    Hypertension Brother    Healthy Brother    Healthy Brother    Healthy Brother    Healthy Brother    Healthy Brother    Healthy Brother    Healthy Brother    Healthy Brother     Social History Social History   Tobacco Use   Smoking status: Never   Smokeless tobacco: Never  Vaping Use   Vaping Use: Never used  Substance Use Topics   Alcohol use: No   Drug use: No     Allergies   Patient has no known allergies.   Review of Systems Review of Systems Per HPI  Physical Exam Triage Vital Signs ED Triage Vitals [09/11/22 1413]  Enc Vitals Group     BP (!) 183/130     Pulse Rate 100     Resp (!) 24     Temp 98.1 F (36.7 C)     Temp Source Oral     SpO2 93 %     Weight      Height      Head Circumference      Peak Flow      Pain Score      Pain Loc      Pain Edu?      Excl. in [place]?    No data found.  Updated Vital Signs BP (!) 183/130 (BP Location: Left Wrist)   Pulse (!) 106   Temp 98.1 F (36.7 C) (Oral)   Resp (!) 24   SpO2 97%   Visual Acuity Right Eye Distance:   Left Eye Distance:   Bilateral Distance:    Right Eye Near:   Left Eye Near:    Bilateral Near:     Physical Exam Constitutional:      General: She is not in acute distress.    Appearance: Normal appearance. She is not toxic-appearing or diaphoretic.  HENT:     Head: Normocephalic and atraumatic.     Right Ear: Tympanic membrane and ear canal normal.     Left Ear: Tympanic membrane and ear canal normal.     Nose: Congestion present.     Mouth/Throat:     Mouth: Mucous membranes are moist.     Pharynx: No posterior oropharyngeal erythema.  Eyes:     Extraocular Movements: Extraocular movements intact.     Conjunctiva/sclera: Conjunctivae normal.     Pupils: Pupils are equal, round, and reactive to light.  Cardiovascular:     Rate and Rhythm: Normal rate and regular rhythm.     Pulses: Normal pulses.     Heart sounds: Normal heart sounds.   Pulmonary:     Effort: No respiratory distress.  Breath sounds: Normal breath sounds. No stridor. No wheezing, rhonchi or rales.     Comments: tachypnea Abdominal:     General: Abdomen is flat. Bowel sounds are normal.     Palpations: Abdomen is soft.  Musculoskeletal:        General: Normal range of motion.     Cervical back: Normal range of motion.  Skin:    General: Skin is warm and dry.  Neurological:     General: No focal deficit present.     Mental Status: She is alert and oriented to person, place, and time. Mental status is at baseline.  Psychiatric:        Mood and Affect: Mood normal.        Behavior: Behavior normal.      UC Treatments / Results  Labs (all labs ordered are listed, but only abnormal results are displayed) Labs Reviewed - No data to display  EKG   Radiology No results found.  Procedures Procedures (including critical care time)  Medications Ordered in UC Medications  ipratropium-albuterol (DUONEB) 0.5-2.5 (3) MG/3ML nebulizer solution 3 mL (3 mLs Nebulization Given 09/11/22 1428)    Initial Impression / Assessment and Plan / UC Course  I have reviewed the triage vital signs and the nursing notes.  Pertinent labs & imaging results that were available during my care of the patient were reviewed by me and considered in my medical decision making (see chart for details).     Patient's symptoms are most likely viral in etiology.  Although, patient has tachypnea on exam as well as low oxygen saturation.  Oxygen was about 93% on initial triage.  Administered DuoNeb with no improvement in tachypnea.  Oxygen increased to 98% for short amount of time, then decreased to 91%. Nasal cannula applied.  Patient also mildly tachycardic.  Patient has elevated blood pressure but this appears baseline.  Given oxygen saturation, I do recommend the patient go to the hospital via EMS for further evaluation and management.  Patient was agreeable with plan and  left via EMS. Final Clinical Impressions(s) / UC Diagnoses   Final diagnoses:  Low oxygen saturation  Viral upper respiratory tract infection with cough     Discharge Instructions      Patient sent to hospital via EMS.     ED Prescriptions   None    PDMP not reviewed this encounter.   Teodora Medici, [place] 09/11/22 [phone]

## 2022-09-11 NOTE — ED Notes (Signed)
Patient is being discharged from the Urgent Care and sent to the Emergency Department via EMS . Per Oswaldo Conroy NP, patient is in need of higher level of care due to SOB and rapid breathing . Patient is aware and verbalizes understanding of plan of care.  Vitals:   09/11/22 1413 09/11/22 1437  BP: (!) 183/130   Pulse: 100 (!) 106  Resp: (!) 24   Temp: 98.1 F (36.7 C)   SpO2: 93% 97%

## 2022-09-11 NOTE — ED Triage Notes (Signed)
Patient BIB [facility] from urgent care for further revaluation of a URI, history of bronchitis, room air SpO2 93% at urgent care they called EMS. Room air SpO2 99% with room air. Patient is alert, oriented, and in no apparent distress at this time.

## 2022-09-12 ENCOUNTER — Other Ambulatory Visit: Payer: Self-pay

## 2022-09-12 ENCOUNTER — Encounter (HOSPITAL_COMMUNITY): Payer: Self-pay

## 2022-09-12 LAB — CBC WITH DIFFERENTIAL/PLATELET
Abs Immature Granulocytes: 0.02 10*3/uL (ref 0.00–0.07)
Basophils Absolute: 0 10*3/uL (ref 0.0–0.1)
Basophils Relative: 1 %
Eosinophils Absolute: 0.1 10*3/uL (ref 0.0–0.5)
Eosinophils Relative: 1 %
HCT: 47.2 % — ABNORMAL HIGH (ref 36.0–46.0)
Hemoglobin: 16.2 g/dL — ABNORMAL HIGH (ref 12.0–15.0)
Immature Granulocytes: 0 %
Lymphocytes Relative: 41 %
Lymphs Abs: 3 10*3/uL (ref 0.7–4.0)
MCH: 29.8 pg (ref 26.0–34.0)
MCHC: 34.3 g/dL (ref 30.0–36.0)
MCV: 86.9 fL (ref 80.0–100.0)
Monocytes Absolute: 0.7 10*3/uL (ref 0.1–1.0)
Monocytes Relative: 9 %
Neutro Abs: 3.6 10*3/uL (ref 1.7–7.7)
Neutrophils Relative %: 48 %
Platelets: 217 10*3/uL (ref 150–400)
RBC: 5.43 MIL/uL — ABNORMAL HIGH (ref 3.87–5.11)
RDW: 12.3 % (ref 11.5–15.5)
WBC: 7.4 10*3/uL (ref 4.0–10.5)
nRBC: 0 % (ref 0.0–0.2)

## 2022-09-12 LAB — RESP PANEL BY RT-PCR (FLU A&B, COVID) ARPGX2
Influenza A by PCR: NEGATIVE
Influenza B by PCR: NEGATIVE
SARS Coronavirus 2 by RT PCR: NEGATIVE

## 2022-09-12 LAB — BASIC METABOLIC PANEL
Anion gap: 11 (ref 5–15)
BUN: 11 mg/dL (ref 6–20)
CO2: 25 mmol/L (ref 22–32)
Calcium: 9.4 mg/dL (ref 8.9–10.3)
Chloride: 99 mmol/L (ref 98–111)
Creatinine, Ser: 0.88 mg/dL (ref 0.44–1.00)
GFR, Estimated: >60 mL/min (ref >60)
Glucose, Bld: 339 mg/dL — ABNORMAL HIGH (ref 70–99)
Potassium: 3.5 mmol/L (ref 3.5–5.1)
Sodium: 135 mmol/L (ref 135–145)

## 2022-09-12 LAB — CBG MONITORING, ED: Glucose-Capillary: 360 mg/dL — ABNORMAL HIGH (ref 70–99)

## 2022-09-12 MED ORDER — AZITHROMYCIN 250 MG PO TABS: 250.0000 mg | ORAL_TABLET | Freq: Every day | ORAL | 0 refills | Status: DC

## 2022-09-12 MED ORDER — AZITHROMYCIN 250 MG PO TABS
500.0000 mg | ORAL_TABLET | Freq: Once | ORAL | Status: AC
  Administered 2022-09-12: 500 mg via ORAL
  Filled 2022-09-12: qty 2

## 2022-09-12 MED ORDER — SODIUM CHLORIDE 0.9 % IV BOLUS
1000.0000 mL | Freq: Once | INTRAVENOUS | Status: AC
  Administered 2022-09-12: 1000 mL via INTRAVENOUS

## 2022-09-12 MED ORDER — AMOXICILLIN-POT CLAVULANATE 875-125 MG PO TABS: 1.0000 | ORAL_TABLET | Freq: Two times a day (BID) | ORAL | 0 refills | Status: AC

## 2022-09-12 MED ORDER — AMOXICILLIN-POT CLAVULANATE 875-125 MG PO TABS
1.0000 | ORAL_TABLET | Freq: Once | ORAL | Status: AC
  Administered 2022-09-12: 1 via ORAL
  Filled 2022-09-12: qty 1

## 2022-09-12 MED ORDER — AMLODIPINE BESYLATE 5 MG PO TABS
10.0000 mg | ORAL_TABLET | Freq: Once | ORAL | Status: AC
  Administered 2022-09-12: 10 mg via ORAL
  Filled 2022-09-12: qty 2

## 2022-09-12 MED ORDER — HYDROCHLOROTHIAZIDE 25 MG PO TABS
25.0000 mg | ORAL_TABLET | Freq: Once | ORAL | Status: AC
  Administered 2022-09-12: 25 mg via ORAL
  Filled 2022-09-12: qty 1

## 2022-09-12 NOTE — Discharge Instructions (Signed)
You were seen in the emergency department for your cough and your shortness of breath.  Your x-ray here shows that you have pneumonia and this is treated with antibiotics.  We gave you your first dose of antibiotics here in the emergency department.  The azithromycin you will take your next dose starting tomorrow and the Augmentin next dose will be starting tonight.  Your blood pressure and blood sugar were also high here in the emergency department but you had no signs of any complications from her blood pressure or sugar being high.  You should continue to take your home medications as prescribed and follow-up with your primary doctor in the next few days to have your blood pressure and blood sugar rechecked in addition to rechecking your pneumonia symptoms.  You should return to the emergency department if you have significantly worsening shortness of breath, you have severe chest pain, you have fevers despite the antibiotics or if you have any other new or concerning symptoms.

## 2022-09-12 NOTE — ED Provider Notes (Signed)
[facility] EMERGENCY DEPARTMENT Provider Note   CSN: [id number] Arrival date & time: 09/11/22  1532     History  Chief Complaint  Patient presents with   Shortness of Breath    Kathleen Stout is a 51 y.o. female.  Patient is a 51 year old female with a past medical history of hypertension, diabetes and bronchitis presenting to the emergency department with cough and shortness of breath.  Patient reports that last Wednesday she started to develop hot and cold sweats with cough and shortness of breath.  She states that her cough is productive of green sputum.  She states that she has shortness of breath on exertion.  She states that she only has pain when she coughs.  She denies any runny nose, sore throat, nausea, vomiting or diarrhea.  She states she has not been following her blood sugars at home.  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  She was seen at urgent care yesterday who told her that her oxygen levels were low and recommended that she come to the emergency department.  The history is provided by the patient.       Home Medications Prior to Admission medications   Medication Sig Start Date End Date Taking? Authorizing Provider  amoxicillin-clavulanate (AUGMENTIN) 875-125 MG tablet Take 1 tablet by mouth every 12 (twelve) hours for 5 days. 09/12/22 09/17/22 Yes Kingsley, [place], DO  azithromycin (ZITHROMAX) 250 MG tablet Take 1 tablet (250 mg total) by mouth daily. Take first 2 tablets together, then 1 every day until finished. 09/12/22  Yes Leanord Asal K, DO  Accu-Chek Softclix Lancets lancets Use as instructed. Inject into the skin twice daily/ ICD 10 E11.65 04/12/21   Gildardo Pounds, NP  amLODipine (NORVASC) 10 MG tablet Take 1 tablet (10 mg total) by mouth daily. 04/12/21   Gildardo Pounds, NP  benzonatate (TESSALON) 100 MG capsule Take 1 capsule (100 mg total) by mouth every 8 (eight) hours.  07/21/22   Raspet, Derry Skill, PA-C  Blood Glucose Monitoring Suppl (ONETOUCH VERIO) w/Device KIT Please provide patient with insurance approved GLUCOMETER ICD 10 E11.65 04/12/21   Gildardo Pounds, NP  Dulaglutide (TRULICITY) 1.5 ON/6.2XB SOPN Inject 1.5 mg into the skin once a week. 05/20/21   Charlott Rakes, MD  fluticasone-salmeterol (ADVAIR HFA) 284-13 MCG/ACT inhaler Inhale 2 puffs into the lungs 2 (two) times daily. 07/21/22   Raspet, Erin K, PA-C  glucose blood test strip Use as instructed. Inject into the skin twice daily. E11.65 04/12/21   Gildardo Pounds, NP  Insulin Pen Needle (B-D UF III MINI PEN NEEDLES) 31G X 5 MM MISC Use as instructed. Inject into the skin once nightly. 04/12/21   Gildardo Pounds, NP  ketorolac (TORADOL) 10 MG tablet Take 1 tablet (10 mg total) by mouth every 6 (six) hours as needed. 11/19/21   Rodriguez-Southworth, Sunday Spillers, PA-C  lisinopril-hydrochlorothiazide (ZESTORETIC) 20-25 MG tablet Take 1 tablet by mouth daily. 04/12/21   Gildardo Pounds, NP  metFORMIN (GLUCOPHAGE-XR) 500 MG 24 hr tablet Take 2 tablets by mouth after your largest meal x1 week. Then, increase to 2 tablets in the morning and 2 in the evening. 05/06/21   Charlott Rakes, MD  ondansetron (ZOFRAN) 4 MG tablet Take 1 tablet (4 mg total) by mouth every 6 (six) hours as needed for nausea or vomiting. 06/15/21   Teodora Medici, FNP  simvastatin (ZOCOR) 20 MG tablet Take 1  tablet (20 mg total) by mouth at bedtime. 04/12/21 07/11/21  Gildardo Pounds, NP      Allergies    Patient has no known allergies.    Review of Systems   Review of Systems  Physical Exam Updated Vital Signs BP (!) 187/104   Pulse 77   Temp 97.6 F (36.4 C) (Oral)   Resp 16   Ht 5' 3.5" (1.613 m)   Wt 129 kg   SpO2 97%   BMI 49.59 kg/m  Physical Exam Vitals and nursing note reviewed.  Constitutional:      General: She is not in acute distress.    Appearance: Normal appearance.  HENT:     Head: Normocephalic and atraumatic.      Nose: Nose normal.     Mouth/Throat:     Mouth: Mucous membranes are moist.     Pharynx: Oropharynx is clear.  Eyes:     Extraocular Movements: Extraocular movements intact.     Conjunctiva/sclera: Conjunctivae normal.  Cardiovascular:     Rate and Rhythm: Normal rate and regular rhythm.     Pulses: Normal pulses.     Heart sounds: Normal heart sounds.  Pulmonary:     Effort: Pulmonary effort is normal.     Breath sounds: Normal breath sounds.  Abdominal:     General: Abdomen is flat.     Palpations: Abdomen is soft.     Tenderness: There is no abdominal tenderness.  Musculoskeletal:        General: Normal range of motion.     Cervical back: Normal range of motion and neck supple.     Right lower leg: No edema.     Left lower leg: No edema.  Skin:    General: Skin is warm and dry.  Neurological:     General: No focal deficit present.     Mental Status: She is alert and oriented to person, place, and time.  Psychiatric:        Mood and Affect: Mood normal.        Behavior: Behavior normal.     ED Results / Procedures / Treatments   Labs (all labs ordered are listed, but only abnormal results are displayed) Labs Reviewed  CBC WITH DIFFERENTIAL/PLATELET - Abnormal; Notable for the following components:      Result Value   RBC 5.43 (*)    Hemoglobin 16.2 (*)    HCT 47.2 (*)    All other components within normal limits  BASIC METABOLIC PANEL - Abnormal; Notable for the following components:   Glucose, Bld 339 (*)    All other components within normal limits  CBG MONITORING, ED - Abnormal; Notable for the following components:   Glucose-Capillary 360 (*)    All other components within normal limits  RESP PANEL BY RT-PCR (FLU A&B, COVID) [id number]    EKG None  Radiology DG Chest 2 View  Result Date: 09/11/2022 CLINICAL DATA:  Cough, hypoxia, shortness of breath EXAM: CHEST - 2 VIEW COMPARISON:  04/23/2020 FINDINGS: Transverse diameter of heart is slightly  increased. There are no signs of pulmonary edema. There is some crowding of markings in the medial right lower lung field. Rest of the lung fields are unremarkable. There is no pleural effusion or pneumothorax. IMPRESSION: There are no signs of pulmonary edema. Increased markings are seen in medial right lower lung fields suggesting subsegmental atelectasis or early pneumonia. There is no pleural effusion. Electronically Signed   By: Prudy Feeler.D.  On: 09/11/2022 16:55    Procedures Procedures    Medications Ordered in ED Medications  amoxicillin-clavulanate (AUGMENTIN) 875-125 MG per tablet 1 tablet (has no administration in time range)  azithromycin (ZITHROMAX) tablet 500 mg (has no administration in time range)  sodium chloride 0.9 % bolus 1,000 mL (1,000 mLs Intravenous New Bag/Given 09/12/22 0942)  amLODipine (NORVASC) tablet 10 mg (10 mg Oral Given 09/12/22 1041)  hydrochlorothiazide (HYDRODIURIL) tablet 25 mg (25 mg Oral Given 09/12/22 1041)    ED Course/ Medical Decision Making/ A&P Clinical Course as of 09/12/22 1108  Tue Sep 12, 2022  0922 Glucose 360, labs will be performed to evaluate for possible DKA in the setting of her pneumonia. [VK]  1101 Patient's labs show that she is hyperglycemic but without any signs of DKA.  Ambulatory pulse ox was performed and she remained at 100%.  She is stable for discharge on oral antibiotics. [VK]    Clinical Course User Index [VK] Kemper Durie, DO                           Medical Decision Making This patient presents to the ED with chief complaint(s) of cough and shortness of breath with pertinent past medical history of HTN, DM, and bronchitis which further complicates the presenting complaint. The complaint involves an extensive differential diagnosis and also carries with it a high risk of complications and morbidity.    The differential diagnosis includes viral syndrome, pneumonia, DKA, pneumothorax, pulmonary  edema, pulmonary effusion  Additional history obtained: Additional history obtained from N/A Records reviewed urgent care records  ED Course and Reassessment: Patient was initially evaluated by provider in triage and had CXR performed that showed possible pneumonia which is likely cause of her SOB. She has been satting well on room air since being here and will have an ambulatory pulse ox performed.  Accu-Chek will be performed to evaluate for possible DKA complicating her pneumonia.  She will be tested for COVID and flu.  Independent labs interpretation:  The following labs were independently interpreted: Hyperglycemia otherwise within normal range  Independent visualization of imaging: - I independently visualized the following imaging with scope of interpretation limited to determining acute life threatening conditions related to emergency care: Chest x-ray, which revealed right lower lobe consolidation  Consultation: - Consulted or discussed management/test interpretation w/ external professional: N/A  Consideration for admission or further workup: Patient has no emergent conditions requiring admission and is stable for discharge at this time with primary care follow-up. Social Determinants of health: N/A    Amount and/or Complexity of Data Reviewed Labs: ordered.  Risk Prescription drug management.          Final Clinical Impression(s) / ED Diagnoses Final diagnoses:  Community acquired pneumonia of right lower lobe of lung  Hyperglycemia  Primary hypertension    Rx / DC Orders ED Discharge Orders          Ordered    azithromycin (ZITHROMAX) 250 MG tablet  Daily        09/12/22 1107    amoxicillin-clavulanate (AUGMENTIN) 875-125 MG tablet  Every 12 hours        09/12/22 1107              Kemper Durie, DO 09/12/22 1108

## 2022-11-29 ENCOUNTER — Ambulatory Visit: Payer: Commercial Managed Care - HMO | Admitting: Family

## 2023-06-12 ENCOUNTER — Other Ambulatory Visit: Payer: Self-pay

## 2023-06-12 ENCOUNTER — Encounter (HOSPITAL_BASED_OUTPATIENT_CLINIC_OR_DEPARTMENT_OTHER): Payer: Self-pay | Admitting: Emergency Medicine

## 2023-06-12 ENCOUNTER — Emergency Department (HOSPITAL_BASED_OUTPATIENT_CLINIC_OR_DEPARTMENT_OTHER)
Admission: EM | Admit: 2023-06-12 | Discharge: 2023-06-12 | Disposition: A | Discharge status: Home / Self Care | Payer: BLUE CROSS/BLUE SHIELD | Attending: Emergency Medicine | Admitting: Emergency Medicine

## 2023-06-12 ENCOUNTER — Other Ambulatory Visit (HOSPITAL_BASED_OUTPATIENT_CLINIC_OR_DEPARTMENT_OTHER): Payer: Self-pay

## 2023-06-12 DIAGNOSIS — M542 Cervicalgia: Secondary | ICD-10-CM | POA: Diagnosis present

## 2023-06-12 DIAGNOSIS — E119 Type 2 diabetes mellitus without complications: Secondary | ICD-10-CM | POA: Insufficient documentation

## 2023-06-12 DIAGNOSIS — R1084 Generalized abdominal pain: Secondary | ICD-10-CM | POA: Diagnosis not present

## 2023-06-12 DIAGNOSIS — S161XXA Strain of muscle, fascia and tendon at neck level, initial encounter: Secondary | ICD-10-CM | POA: Diagnosis not present

## 2023-06-12 DIAGNOSIS — Y92410 Unspecified street and highway as the place of occurrence of the external cause: Secondary | ICD-10-CM | POA: Insufficient documentation

## 2023-06-12 DIAGNOSIS — I10 Essential (primary) hypertension: Secondary | ICD-10-CM | POA: Diagnosis not present

## 2023-06-12 MED ORDER — CYCLOBENZAPRINE HCL 10 MG PO TABS: 10.0000 mg | ORAL_TABLET | Freq: Three times a day (TID) | ORAL | 0 refills | Status: AC | PRN

## 2023-06-12 MED ORDER — IBUPROFEN 600 MG PO TABS: 600.0000 mg | ORAL_TABLET | Freq: Four times a day (QID) | ORAL | 0 refills | Status: AC | PRN

## 2023-06-12 MED ORDER — AMLODIPINE BESYLATE 5 MG PO TABS
10.0000 mg | ORAL_TABLET | Freq: Once | ORAL | Status: AC
  Administered 2023-06-12: 10 mg via ORAL
  Filled 2023-06-12: qty 2

## 2023-06-12 MED ORDER — ACETAMINOPHEN 500 MG PO TABS
1000.0000 mg | ORAL_TABLET | Freq: Once | ORAL | Status: AC
  Administered 2023-06-12: 1000 mg via ORAL
  Filled 2023-06-12: qty 2

## 2023-06-12 MED ORDER — CYCLOBENZAPRINE HCL 10 MG PO TABS
10.0000 mg | ORAL_TABLET | Freq: Once | ORAL | Status: AC
  Administered 2023-06-12: 10 mg via ORAL
  Filled 2023-06-12: qty 1

## 2023-06-12 MED ORDER — AMLODIPINE BESYLATE 5 MG PO TABS: 10.0000 mg | ORAL_TABLET | Freq: Every day | ORAL | 2 refills | Status: DC

## 2023-06-12 MED ORDER — KETOROLAC TROMETHAMINE 15 MG/ML IJ SOLN
15.0000 mg | Freq: Once | INTRAMUSCULAR | Status: AC
  Administered 2023-06-12: 15 mg via INTRAMUSCULAR
  Filled 2023-06-12: qty 1

## 2023-06-12 MED ORDER — LIDOCAINE 5 % EX PTCH: 2.0000 | MEDICATED_PATCH | CUTANEOUS | 0 refills | Status: AC

## 2023-06-12 MED ORDER — AMLODIPINE BESYLATE 5 MG PO TABS
10.0000 mg | ORAL_TABLET | Freq: Every day | ORAL | 2 refills | Status: DC
  Filled 2023-06-12: qty 30, 15d supply, fill #0
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
## 2023-06-12 NOTE — ED Notes (Signed)
Patient verbalizes understanding of discharge instructions. Opportunity for questioning and answers were provided. Patient discharged from ED.  °
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
## 2023-10-16 ENCOUNTER — Ambulatory Visit (INDEPENDENT_AMBULATORY_CARE_PROVIDER_SITE_OTHER): Payer: BLUE CROSS/BLUE SHIELD

## 2023-10-16 ENCOUNTER — Ambulatory Visit (HOSPITAL_COMMUNITY)
Admission: EM | Admit: 2023-10-16 | Discharge: 2023-10-16 | Disposition: A | Discharge status: Home / Self Care | Payer: BLUE CROSS/BLUE SHIELD | Attending: Internal Medicine | Admitting: Internal Medicine

## 2023-10-16 ENCOUNTER — Other Ambulatory Visit: Payer: Self-pay

## 2023-10-16 ENCOUNTER — Encounter (HOSPITAL_COMMUNITY): Payer: Self-pay | Admitting: *Deleted

## 2023-10-16 DIAGNOSIS — J40 Bronchitis, not specified as acute or chronic: Secondary | ICD-10-CM | POA: Diagnosis not present

## 2023-10-16 DIAGNOSIS — I10 Essential (primary) hypertension: Secondary | ICD-10-CM | POA: Diagnosis not present

## 2023-10-16 LAB — POCT INFLUENZA A/B
Influenza A, POC: NEGATIVE
Influenza B, POC: NEGATIVE

## 2023-10-16 MED ORDER — AMLODIPINE BESYLATE 5 MG PO TABS: 10.0000 mg | ORAL_TABLET | Freq: Every day | ORAL | 0 refills | Status: DC

## 2023-10-16 MED ORDER — IPRATROPIUM-ALBUTEROL 0.5-2.5 (3) MG/3ML IN SOLN
3.0000 mL | Freq: Once | RESPIRATORY_TRACT | Status: AC
  Administered 2023-10-16: 3 mL via RESPIRATORY_TRACT

## 2023-10-16 MED ORDER — AZITHROMYCIN 250 MG PO TABS: ORAL_TABLET | ORAL | 0 refills | Status: DC

## 2023-10-16 MED ORDER — BENZONATATE 200 MG PO CAPS: 200.0000 mg | ORAL_CAPSULE | Freq: Three times a day (TID) | ORAL | 0 refills | Status: DC | PRN

## 2023-10-16 MED ORDER — IPRATROPIUM-ALBUTEROL 0.5-2.5 (3) MG/3ML IN SOLN
RESPIRATORY_TRACT | Status: AC
  Filled 2023-10-16: qty 3

## 2023-10-16 NOTE — ED Provider Notes (Addendum)
[facility]    CSN: [id number] Arrival date & time: 10/16/23  1154      History   Chief Complaint Chief Complaint  Patient presents with   Cough   Shortness of Breath    HPI Kathleen Stout is a 52 y.o. female who presents with severe cough x 3 days. On occasion has productive cough with yellow mucous. Was chilling all day yesterday and has been fatigued with decreased appetite. He is taking care of her boyfriend who has a trace and needs to make sure she does not pass this to him. Has hx of frequent bronchitis. Gets SOB when she gets cough attacks. Denies CP or edema    Past Medical History:  Diagnosis Date   Anemia    Arthritis    Chest pressure    a. 08/2016 Myoview: EF 68%, no ST changes, basal anterior/mid anterior defect - felt to be attenuation-->Low risk.   Essential hypertension    Morbid obesity (HCC)    Type II diabetes mellitus (HCC)     Patient Active Problem List   Diagnosis Date Noted   Wheezing 11/25/2018   Influenza B 11/25/2018   Pneumonia due to infectious organism 11/25/2018   Depression, recurrent (HCC) 05/29/2018   Chest pressure    Chest pain 09/19/2016   Morbid obesity (HCC)    Type II diabetes mellitus (HCC)    Morbid obesity with BMI of 50.0-59.9, adult (HCC) 08/02/2015   Type 2 diabetes mellitus with obesity (HCC) 08/02/2015   Essential hypertension 05/21/2015   Osteoarthritis of left knee 05/21/2015   Knee pain, left 04/20/2015    Past Surgical History:  Procedure Laterality Date   ABDOMINAL HYSTERECTOMY      OB History   No obstetric history on file.      Home Medications    Prior to Admission medications   Medication Sig Start Date End Date Taking? Authorizing Provider  azithromycin (ZITHROMAX Z-PAK) 250 MG tablet 2 today, then one every day x 4 days 10/16/23  Yes Rodriguez-Southworth, Nettie Elm, PA-C  benzonatate (TESSALON) 200 MG capsule Take 1 capsule (200 mg total) by mouth 3 (three) times daily as needed for  cough. 10/16/23  Yes Rodriguez-Southworth, Nettie Elm, PA-C  Accu-Chek Softclix Lancets lancets Use as instructed. Inject into the skin twice daily/ ICD 10 E11.65 04/12/21   Claiborne Rigg, NP  amLODipine (NORVASC) 5 MG tablet Take 2 tablets (10 mg total) by mouth daily. 10/16/23 11/15/23  Rodriguez-Southworth, Nettie Elm, PA-C  Blood Glucose Monitoring Suppl (ONETOUCH VERIO) w/Device KIT Please provide patient with insurance approved GLUCOMETER ICD 10 E11.65 04/12/21   Claiborne Rigg, NP  Dulaglutide (TRULICITY) 1.5 MG/0.5ML SOPN Inject 1.5 mg into the skin once a week. 05/20/21   Hoy Register, MD  fluticasone-salmeterol (ADVAIR HFA) 831-51 MCG/ACT inhaler Inhale 2 puffs into the lungs 2 (two) times daily. 07/21/22   Raspet, Erin K, PA-C  glucose blood test strip Use as instructed. Inject into the skin twice daily. E11.65 04/12/21   Claiborne Rigg, NP  Insulin Pen Needle (B-D UF III MINI PEN NEEDLES) 31G X 5 MM MISC Use as instructed. Inject into the skin once nightly. 04/12/21   Claiborne Rigg, NP  metFORMIN (GLUCOPHAGE-XR) 500 MG 24 hr tablet Take 2 tablets by mouth after your largest meal x1 week. Then, increase to 2 tablets in the morning and 2 in the evening. 05/06/21   Hoy Register, MD  ondansetron (ZOFRAN) 4 MG tablet Take 1 tablet (4 mg total) by  mouth every 6 (six) hours as needed for nausea or vomiting. 06/15/21   Gustavus Bryant, FNP  simvastatin (ZOCOR) 20 MG tablet Take 1 tablet (20 mg total) by mouth at bedtime. 04/12/21 07/11/21  Claiborne Rigg, NP    Family History Family History  Problem Relation Age of Onset   Diabetes Mother    Hypertension Mother    Hyperlipidemia Mother    Heart disease Mother    Aneurysm Mother 7   Healthy Sister    Healthy Brother    Healthy Sister    Healthy Sister    Healthy Sister    Diabetes Brother    Hypertension Brother    Healthy Brother    Healthy Brother    Healthy Brother    Healthy Brother    Healthy Brother    Healthy Brother     Healthy Brother    Healthy Brother     Social History Social History   Tobacco Use   Smoking status: Never   Smokeless tobacco: Never  Vaping Use   Vaping status: Never Used  Substance Use Topics   Alcohol use: No   Drug use: No     Allergies   Patient has no known allergies.   Review of Systems Review of Systems  As noted in HPI Physical Exam Triage Vital Signs ED Triage Vitals  Encounter Vitals Group     BP 10/16/23 1235 (!) 167/118     Systolic BP Percentile --      Diastolic BP Percentile --      Pulse Rate 10/16/23 1235 80     Resp 10/16/23 1235 20     Temp 10/16/23 1235 98 F (36.7 C)     Temp src --      SpO2 10/16/23 1235 97 %     Weight --      Height --      Head Circumference --      Peak Flow --      Pain Score 10/16/23 1231 7     Pain Loc --      Pain Education --      Exclude from Growth Chart --    No data found.  Updated Vital Signs BP (!) 167/118   Pulse 80   Temp 98 F (36.7 C)   Resp 20   SpO2 97%   Visual Acuity Right Eye Distance:   Left Eye Distance:   Bilateral Distance:    Right Eye Near:   Left Eye Near:    Bilateral Near:     Physical Exam Physical Exam Vitals signs and nursing note reviewed.  Constitutional:      General: She is not in acute distress.    Appearance: Normal appearance. She is not ill-appearing, toxic-appearing or diaphoretic.  HENT:     Head: Normocephalic.     Right Ear: Tympanic membrane, ear canal and external ear normal.     Left Ear: Tympanic membrane, ear canal and external ear normal.     Nose: Nose normal.     Mouth/Throat: clear    Mouth: Mucous membranes are moist.  Eyes:     General: No scleral icterus.       Right eye: No discharge.        Left eye: No discharge.     Conjunctiva/sclera: Conjunctivae normal.  Neck:     Musculoskeletal: Neck supple. No neck rigidity.  Cardiovascular:     Rate and Rhythm: Normal rate and regular rhythm.  Heart sounds: No murmur. No edema  noted Pulmonary: She seems SOB when I walked in the room, but was breathing better after the neb treatment     Effort: Pulmonary effort is normal.     Breath sounds: has decreased breath sound all over. No crackles wheezing or rhonchi noted.   .  Musculoskeletal: Normal range of motion.  Lymphadenopathy:     Cervical: No cervical adenopathy.  Skin:    General: Skin is warm and dry.     Coloration: Skin is not jaundiced.     Findings: No rash.  Neurological:     Mental Status: She is alert and oriented to person, place, and time.     Gait: Gait normal.  Psychiatric:        Mood and Affect: Mood normal.        Behavior: Behavior normal.        Thought Content: Thought content normal.        Judgment: Judgment normal.    UC Treatments / Results  Labs (all labs ordered are listed, but only abnormal results are displayed) Rapid flu negative  EKG   Radiology DG Chest 2 View  Result Date: 10/16/2023 CLINICAL DATA:  Cough EXAM: CHEST - 2 VIEW COMPARISON:  Chest x-ray 09/06/2016 FINDINGS: The heart size and mediastinal contours are within normal limits. There is a linear band of atelectasis or scarring in the right mid lung which is new from 2017. Lungs are otherwise clear. There is no pleural effusion or pneumothorax. The visualized skeletal structures are unremarkable. IMPRESSION: Linear band of atelectasis or scarring in the right mid lung which is new from 2017. Electronically Signed   By: Darliss Cheney M.D.   On: 10/16/2023 17:52   Has increased marking on R lung, but not as prominent when she had pneumonia 08/2022 Procedures Procedures (including critical care time)  Medications Ordered in UC Medications  ipratropium-albuterol (DUONEB) 0.5-2.5 (3) MG/3ML nebulizer solution 3 mL (3 mLs Nebulization Given 10/16/23 1254)    Initial Impression / Assessment and Plan / UC Course  I have reviewed the triage vital signs and the nursing notes.  Pertinent labs & imaging results that  were available during my care of the patient were reviewed by me and considered in my medical decision making (see chart for details).  Bronchitis Uncontrolled HTN  I placed her on Zpack and Tessalon as noted Covid test is pending and we will inform her if positive I refilled her BP med and was told to establish with one of the practices I listed for her. She was educated how untreated HTN can cause heart failure and the importance of taking her HTN medication   I emailed her her results and she was advised to have the CRX repeated in 6 weeks.  Final Clinical Impressions(s) / UC Diagnoses   Final diagnoses:  Bronchitis  Uncontrolled hypertension     Discharge Instructions      I will call you when the radiologist finalizes the xray report  I refilled your blood pressure medication which you need to get back on it Make appointment with one of the clinics above as soon as you can.      ED Prescriptions     Medication Sig Dispense Auth. Provider   amLODipine (NORVASC) 5 MG tablet Take 2 tablets (10 mg total) by mouth daily. 60 tablet Rodriguez-Southworth, Diva Lember[place], PA-C   azithromycin (ZITHROMAX Z-PAK) 250 MG tablet 2 today, then one every day x 4 days 6  tablet Rodriguez-Southworth, Nettie Elm, PA-C   benzonatate (TESSALON) 200 MG capsule Take 1 capsule (200 mg total) by mouth 3 (three) times daily as needed for cough. 30 capsule Rodriguez-Southworth, Nettie Elm, PA-C      PDMP not reviewed this encounter.   Garey Ham, PA-C 10/16/23 1349    Rodriguez-Southworth, [place], PA-C 10/16/23 1856

## 2023-10-16 NOTE — ED Triage Notes (Signed)
Pt reports she has a bad cough and can hardly breath. Sx's started on SAT.

## 2023-10-16 NOTE — Discharge Instructions (Signed)
I will call you when the radiologist finalizes the xray report  I refilled your blood pressure medication which you need to get back on it Make appointment with one of the clinics above as soon as you can.

## 2024-03-27 ENCOUNTER — Other Ambulatory Visit: Payer: Self-pay

## 2024-03-27 ENCOUNTER — Emergency Department (HOSPITAL_COMMUNITY): Payer: Self-pay

## 2024-03-27 ENCOUNTER — Emergency Department (HOSPITAL_COMMUNITY)
Admission: EM | Admit: 2024-03-27 | Discharge: 2024-03-27 | Disposition: A | Discharge status: Home / Self Care | Payer: Self-pay | Attending: Emergency Medicine | Admitting: Emergency Medicine

## 2024-03-27 DIAGNOSIS — E119 Type 2 diabetes mellitus without complications: Secondary | ICD-10-CM | POA: Insufficient documentation

## 2024-03-27 DIAGNOSIS — Z79899 Other long term (current) drug therapy: Secondary | ICD-10-CM | POA: Insufficient documentation

## 2024-03-27 DIAGNOSIS — M25561 Pain in right knee: Secondary | ICD-10-CM | POA: Insufficient documentation

## 2024-03-27 DIAGNOSIS — Z794 Long term (current) use of insulin: Secondary | ICD-10-CM | POA: Insufficient documentation

## 2024-03-27 DIAGNOSIS — Z7984 Long term (current) use of oral hypoglycemic drugs: Secondary | ICD-10-CM | POA: Insufficient documentation

## 2024-03-27 DIAGNOSIS — G8911 Acute pain due to trauma: Secondary | ICD-10-CM | POA: Insufficient documentation

## 2024-03-27 DIAGNOSIS — I10 Essential (primary) hypertension: Secondary | ICD-10-CM | POA: Insufficient documentation

## 2024-03-27 MED ORDER — OXYCODONE-ACETAMINOPHEN 5-325 MG PO TABS
1.0000 | ORAL_TABLET | Freq: Once | ORAL | Status: AC
  Administered 2024-03-27: 1 via ORAL
  Filled 2024-03-27: qty 1

## 2024-03-27 NOTE — ED Provider Notes (Signed)
 [place] EMERGENCY DEPARTMENT AT [facility] Provider Note   CSN: [id number] Arrival date & time: 03/27/24  7846     History  Chief Complaint  Patient presents with   Knee Pain    Kathleen Stout is a 53 y.o. female history of hypertension, diabetes presented for right knee pain that began yesterday when she was trying to put on pants.  Patient states she felt a pop when she bent over and states that since then she has not been on bear weight.  Patient denies any paresthesias or new onset weakness distally in her right lower extremity.  Patient denies any trauma, overlying skin color changes, hip pain, falls.  Patient has not taken any medications for this.  Home Medications Prior to Admission medications   Medication Sig Start Date End Date Taking? Authorizing Provider  Accu-Chek Softclix Lancets lancets Use as instructed. Inject into the skin twice daily/ ICD 10 E11.65 04/12/21   Fleming, Zelda W, NP  amLODipine  (NORVASC ) 5 MG tablet Take 2 tablets (10 mg total) by mouth daily. 10/16/23 11/15/23  Rodriguez-Southworth, Sylvia, PA-C  azithromycin  (ZITHROMAX  Z-PAK) 250 MG tablet 2 today, then one every day x 4 days 10/16/23   Rodriguez-Southworth, Lamond Pilot, PA-C  benzonatate  (TESSALON ) 200 MG capsule Take 1 capsule (200 mg total) by mouth 3 (three) times daily as needed for cough. 10/16/23   Rodriguez-Southworth, Lamond Pilot, PA-C  Blood Glucose Monitoring Suppl (ONETOUCH VERIO) w/Device KIT Please provide patient with insurance approved GLUCOMETER ICD 10 E11.65 04/12/21   Collins Dean, NP  Dulaglutide  (TRULICITY ) 1.5 MG/0.5ML SOPN Inject 1.5 mg into the skin once a week. 05/20/21   Newlin, Enobong, MD  fluticasone -salmeterol (ADVAIR HFA) 115-21 MCG/ACT inhaler Inhale 2 puffs into the lungs 2 (two) times daily. 07/21/22   Raspet, Erin K, PA-C  glucose blood test strip Use as instructed. Inject into the skin twice daily. E11.65 04/12/21   Fleming, Zelda W, NP  Insulin  Pen Needle (B-D UF  III MINI PEN NEEDLES) 31G X 5 MM MISC Use as instructed. Inject into the skin once nightly. 04/12/21   Fleming, Zelda W, NP  metFORMIN  (GLUCOPHAGE -XR) 500 MG 24 hr tablet Take 2 tablets by mouth after your largest meal x1 week. Then, increase to 2 tablets in the morning and 2 in the evening. 05/06/21   Newlin, Enobong, MD  ondansetron  (ZOFRAN ) 4 MG tablet Take 1 tablet (4 mg total) by mouth every 6 (six) hours as needed for nausea or vomiting. 06/15/21   Dodson Freestone, FNP  simvastatin  (ZOCOR ) 20 MG tablet Take 1 tablet (20 mg total) by mouth at bedtime. 04/12/21 07/11/21  Fleming, Zelda W, NP      Allergies    Patient has no known allergies.    Review of Systems   Review of Systems  Physical Exam Updated Vital Signs BP (!) 199/87 (BP Location: Right Arm)   Pulse 85   Temp 97.8 F (36.6 C)   Resp 16   Ht 5\' 4"  (1.626 m)   Wt 106.6 kg   SpO2 100%   BMI 40.34 kg/m  Physical Exam Vitals reviewed.  Constitutional:      General: She is not in acute distress. Cardiovascular:     Rate and Rhythm: Normal rate.     Pulses: Normal pulses.  Musculoskeletal:     Comments: Right hip: 5 out of 5 hip flexion, nontender, no signs of trauma Right knee: 3 out of 5 knee extension/flexion, generalized tenderness throughout, no crepitus or  step-offs noted, intact quadricep tendon and patellar ligament; negative anterior/posterior drawer test, varus valgus stress test Right ankle: 5/5 plantarflexion with dorsiflexion, able to wiggle toes Pain not out of proportion Soft compartments  Skin:    General: Skin is warm and dry.     Capillary Refill: Capillary refill takes less than 2 seconds.  Neurological:     Mental Status: She is alert.     Comments: Sensation intact distally  Psychiatric:        Mood and Affect: Mood normal.     ED Results / Procedures / Treatments   Labs (all labs ordered are listed, but only abnormal results are displayed) Labs Reviewed - No data to  display  EKG None  Radiology DG Knee Complete 4 Views Right Result Date: 03/27/2024 CLINICAL DATA:  52 year old female with right knee pain "felt a pop." EXAM: RIGHT KNEE - COMPLETE 4+ VIEW COMPARISON:  Right knee series 09/27/2014. FINDINGS: Four views including cross-table lateral at 0744 hours. Bone mineralization is within normal limits for age. Progressed since 2015 and now moderate to severe medial compartment joint space loss, moderate degenerative spurring there. Progressed patellofemoral compartment joint space loss and degenerative spurring is moderate. No definite joint effusion. Patella intact. Mild lateral compartment degenerative spurring is stable. No acute osseous abnormality identified. 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 No acute osseous abnormality identified. Electronically Signed   By: Marlise Simpers M.D.   On: 03/27/2024 07:56    Procedures Procedures    Medications Ordered in ED Medications  oxyCODONE -acetaminophen  (PERCOCET/ROXICET) 5-325 MG per tablet 1 tablet (has no administration in time range)    ED Course/ Medical Decision Making/ A&P                                 Medical Decision Making Amount and/or Complexity of Data Reviewed Radiology: ordered.  Risk Prescription drug management.   Kathleen Stout 53 y.o. presented today for right knee pain. Working DDx that I considered at this time includes, but not limited to, arthritis, contusion, strain/sprain, fracture, dislocation, neurovascular compromise, septic joint, ischemic limb, compartment syndrome.  R/o DDx: contusion,  fracture, dislocation, neurovascular compromise, septic joint, ischemic limb, compartment syndrome: These are considered less likely due to history of present illness, physical exam, labs/imaging findings.  Review of prior external notes: 02/21/2023 office visit  Unique Tests and My Independent  Interpretation:  Right knee x-ray: Moderate to severe patellofemoral degeneration  Social Determinants of Health: none  Discussion with Independent Historian: None  Discussion of Management of Tests: None  Risk: Medium: prescription drug management  Risk Stratification Score: None  Plan: On exam patient was no acute distress but was mildly hypertensive on exam.  Patient is not endorsing a chest pain shortness of breath and is not taking medications this morning and so recommend that she takes her medications when she gets home.  Also do think the pain is exacerbating her blood pressure as well.  On exam patient has soft compartments and pain is not out of proportion.  Patient is neurovascularly intact.  Patient does have generalized tenderness throughout her knee without any concerning features.  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  Will give patient knee brace here along with pain  meds.  I did offer a work note however patient declined.  Will give crutches to help patient ambulate.  Patient's physical exam and imaging are reassuring.  I spoke to the patient about RICE therapy including Tylenol  every 6 hours needed pain, ice 3-4 times daily for 15 minutes at a time, elevation of extremity, using a brace and to follow-up with their primary care provider.  Patient was given return precautions. Patient stable for discharge at this time.  Patient verbalized understanding of plan.  This chart was dictated using voice recognition software.  Despite best efforts to proofread,  errors can occur which can change the documentation meaning.         Final Clinical Impression(s) / ED Diagnoses Final diagnoses:  Acute pain of right knee    Rx / DC Orders ED Discharge Orders     None         Elex Grimmer 03/27/24  0827    Ninetta Basket, MD 04/01/24 2035

## 2024-03-27 NOTE — ED Triage Notes (Signed)
 Pt. Stated, I was getting dressed yesterday morning and as I was putting on my pants I felt something pop in my right knee and since then I can't hardly walk on it.

## 2024-03-27 NOTE — ED Notes (Signed)
 Pt verbalized understanding of discharge instructions, use of knee brace and crutches.

## 2024-03-27 NOTE — Discharge Instructions (Addendum)
 Please follow-up with your primary care provider in regards to recent ER visit. Today's physical exam and imaging was reassuring and you most likely have a benign process going on. You may rest, ice, compress, elevate your extremity and use Tylenol every 6 hours as needed for pain. If you begin to have decreased sensation, skin color changes, pain out of proportion/not controlled by over-the-counter medications, rigid compartments, or other worsening of symptoms please return to ER.

## 2024-07-15 ENCOUNTER — Ambulatory Visit (HOSPITAL_COMMUNITY)
Admission: EM | Admit: 2024-07-15 | Discharge: 2024-07-15 | Disposition: A | Discharge status: Home / Self Care | Payer: Self-pay | Attending: Emergency Medicine | Admitting: Emergency Medicine

## 2024-07-15 ENCOUNTER — Encounter (HOSPITAL_COMMUNITY): Payer: Self-pay | Admitting: *Deleted

## 2024-07-15 ENCOUNTER — Other Ambulatory Visit (HOSPITAL_COMMUNITY): Payer: Self-pay

## 2024-07-15 DIAGNOSIS — H1031 Unspecified acute conjunctivitis, right eye: Secondary | ICD-10-CM

## 2024-07-15 DIAGNOSIS — I10 Essential (primary) hypertension: Secondary | ICD-10-CM

## 2024-07-15 MED ORDER — MOXIFLOXACIN HCL 0.5 % OP SOLN
1.0000 [drp] | Freq: Three times a day (TID) | OPHTHALMIC | 0 refills | Status: AC
  Filled 2024-07-15: qty 3, 20d supply, fill #0

## 2024-07-15 MED ORDER — AMLODIPINE BESYLATE 5 MG PO TABS
5.0000 mg | ORAL_TABLET | Freq: Every day | ORAL | 2 refills | Status: DC
  Filled 2024-07-15: qty 60, 60d supply, fill #0

## 2024-07-15 NOTE — Discharge Instructions (Signed)
 Vigamox  -- 1 drop in the right eye, 3 times daily, for 7 days  Re-start amlodipine  5 mg -- 1 tablet daily If this is not affordable with [facility], I will send it to [facility] for you. You can make a free GoodRx account online, and get the prescription 2 month supply for about $10

## 2024-07-15 NOTE — ED Triage Notes (Signed)
 Pt states her right eye has been red since yesterday after work, At work they flushed eye with saline and used eye wash.

## 2024-07-15 NOTE — ED Provider Notes (Signed)
 [facility]    CSN: [id number] Arrival date & time: 07/15/24  1529     History   Chief Complaint Chief Complaint  Patient presents with   Eye Problem    HPI Kathleen Stout is a 53 y.o. female.  Yesterday eye started getting red and irritated, watery Last night, green/yellow discharge Matted lashes today Coworker had pink eye  HTN history. Used to take amlodipine  last year. Has not taken due to no insurance and financial barriers.   Not having eye pain, dizziness, vision changes, headache, chest pain, shortness of breath, abdominal pain, weakness  No PCP  Past Medical History:  Diagnosis Date   Anemia    Arthritis    Chest pressure    a. 08/2016 Myoview : EF 68%, no ST changes, basal anterior/mid anterior defect - felt to be attenuation-->Low risk.   Essential hypertension    Morbid obesity (HCC)    Type II diabetes mellitus (HCC)     Patient Active Problem List   Diagnosis Date Noted   Wheezing 11/25/2018   Influenza B 11/25/2018   Pneumonia due to infectious organism 11/25/2018   Depression, recurrent (HCC) 05/29/2018   Chest pressure    Chest pain 09/19/2016   Morbid obesity (HCC)    Type II diabetes mellitus (HCC)    Morbid obesity with BMI of 50.0-59.9, adult (HCC) 08/02/2015   Type 2 diabetes mellitus with obesity (HCC) 08/02/2015   Essential hypertension 05/21/2015   Osteoarthritis of left knee 05/21/2015   Knee pain, left 04/20/2015    Past Surgical History:  Procedure Laterality Date   ABDOMINAL HYSTERECTOMY      OB History   No obstetric history on file.      Home Medications    Prior to Admission medications   Medication Sig Start Date End Date Taking? Authorizing Provider  amLODipine  (NORVASC ) 5 MG tablet Take 1 tablet (5 mg total) by mouth daily. 07/15/24  Yes Eaden Hettinger, Asberry, PA-C  moxifloxacin  (VIGAMOX ) 0.5 % ophthalmic solution Place 1 drop into the right eye 3 (three) times daily for 7 days. 07/15/24 08/04/24 Yes Chenae Brager,  Asberry, PA-C  Accu-Chek Softclix Lancets lancets Use as instructed. Inject into the skin twice daily/ ICD 10 E11.65 04/12/21   Fleming, Zelda W, NP  azithromycin  (ZITHROMAX  Z-PAK) 250 MG tablet 2 today, then one every day x 4 days 10/16/23   Rodriguez-Southworth, Kyra, PA-C  benzonatate  (TESSALON ) 200 MG capsule Take 1 capsule (200 mg total) by mouth 3 (three) times daily as needed for cough. 10/16/23   Rodriguez-Southworth, Kyra, PA-C  Blood Glucose Monitoring Suppl (ONETOUCH VERIO) w/Device KIT Please provide patient with insurance approved GLUCOMETER ICD 10 E11.65 04/12/21   Theotis Haze ORN, NP  Dulaglutide  (TRULICITY ) 1.5 MG/0.5ML SOPN Inject 1.5 mg into the skin once a week. 05/20/21   Newlin, Enobong, MD  fluticasone -salmeterol (ADVAIR HFA) 115-21 MCG/ACT inhaler Inhale 2 puffs into the lungs 2 (two) times daily. 07/21/22   Raspet, Erin K, PA-C  glucose blood test strip Use as instructed. Inject into the skin twice daily. E11.65 04/12/21   Fleming, Zelda W, NP  Insulin  Pen Needle (B-D UF III MINI PEN NEEDLES) 31G X 5 MM MISC Use as instructed. Inject into the skin once nightly. 04/12/21   Fleming, Zelda W, NP  metFORMIN  (GLUCOPHAGE -XR) 500 MG 24 hr tablet Take 2 tablets by mouth after your largest meal x1 week. Then, increase to 2 tablets in the morning and 2 in the evening. 05/06/21   Newlin, Enobong, MD  ondansetron  (ZOFRAN ) 4 MG tablet Take 1 tablet (4 mg total) by mouth every 6 (six) hours as needed for nausea or vomiting. 06/15/21   Hazen Darryle BRAVO, FNP  simvastatin  (ZOCOR ) 20 MG tablet Take 1 tablet (20 mg total) by mouth at bedtime. 04/12/21 07/11/21  Theotis Haze ORN, NP    Family History Family History  Problem Relation Age of Onset   Diabetes Mother    Hypertension Mother    Hyperlipidemia Mother    Heart disease Mother    Aneurysm Mother 15   Healthy Sister    Healthy Brother    Healthy Sister    Healthy Sister    Healthy Sister    Diabetes Brother    Hypertension Brother     Healthy Brother    Healthy Brother    Healthy Brother    Healthy Brother    Healthy Brother    Healthy Brother    Healthy Brother    Healthy Brother     Social History Social History   Tobacco Use   Smoking status: Never   Smokeless tobacco: Never  Vaping Use   Vaping status: Never Used  Substance Use Topics   Alcohol use: No   Drug use: No     Allergies   Patient has no known allergies.   Review of Systems Review of Systems As per HPI  Physical Exam Triage Vital Signs ED Triage Vitals  Encounter Vitals Group     BP 07/15/24 1630 (!) 206/114     Girls Systolic BP Percentile --      Girls Diastolic BP Percentile --      Boys Systolic BP Percentile --      Boys Diastolic BP Percentile --      Pulse Rate 07/15/24 1630 75     Resp 07/15/24 1630 20     Temp 07/15/24 1630 98.2 F (36.8 C)     Temp Source 07/15/24 1630 Oral     SpO2 07/15/24 1630 97 %     Weight --      Height --      Head Circumference --      Peak Flow --      Pain Score 07/15/24 1629 7     Pain Loc --      Pain Education --      Exclude from Growth Chart --    No data found.  Updated Vital Signs BP (!) 162/106 (BP Location: Left Arm)   Pulse 75   Temp 98.2 F (36.8 C) (Oral)   Resp 20   SpO2 97%   Visual Acuity Right Eye Distance: 20/100 Left Eye Distance: 20/50 Bilateral Distance: 20/40  Reports watery vision right eye Did not bring her glasses today  Physical Exam Vitals and nursing note reviewed.  Constitutional:      General: She is not in acute distress.    Appearance: She is not ill-appearing.  HENT:     Mouth/Throat:     Mouth: Mucous membranes are moist.     Pharynx: Oropharynx is clear.  Eyes:     General: Lids are normal. Gaze aligned appropriately.        Right eye: Discharge present. No foreign body.     Extraocular Movements: Extraocular movements intact.     Conjunctiva/sclera:     Right eye: Right conjunctiva is injected.     Pupils: Pupils are equal,  round, and reactive to light.     Comments: Right conjunctival injection. Discharge in  lashes. EOM without pain. No periorbital erythema or tenderness.   Cardiovascular:     Rate and Rhythm: Normal rate and regular rhythm.     Pulses: Normal pulses.     Heart sounds: Normal heart sounds.  Pulmonary:     Effort: Pulmonary effort is normal.     Breath sounds: Normal breath sounds.  Musculoskeletal:        General: Normal range of motion.     Cervical back: Normal range of motion.  Skin:    General: Skin is warm and dry.  Neurological:     Mental Status: She is alert and oriented to person, place, and time.     Cranial Nerves: No cranial nerve deficit.     Sensory: No sensory deficit.     Motor: No weakness.     Coordination: Coordination normal.     Gait: Gait normal.     UC Treatments / Results  Labs (all labs ordered are listed, but only abnormal results are displayed) Labs Reviewed - No data to display  EKG   Radiology No results found.  Procedures Procedures (including critical care time)  Medications Ordered in UC Medications - No data to display  Initial Impression / Assessment and Plan / UC Course  I have reviewed the triage vital signs and the nursing notes.  Pertinent labs & imaging results that were available during my care of the patient were reviewed by me and considered in my medical decision making (see chart for details).  Bacterial conjunctivitis, right Vigamox  3 times daily for 7 days  Hypertension Very elevated initially 203/114. Improved on recheck, 162/106 Neurologically intact. She is open to restarting amlodipine .  I have sent 5 mg daily dosing to the [facility].  We discussed if this is not affordable, I can resend it to [facility] where she can get a 51-month supply for about $10 with good Rx coupon.  Community health and wellness information is given, patient will call to make an appointment for follow-up.  Discussed return and  ED precautions.  Agrees to plan, no questions  Final Clinical Impressions(s) / UC Diagnoses   Final diagnoses:  Acute bacterial conjunctivitis of right eye  Hypertension, unspecified type     Discharge Instructions      Vigamox  -- 1 drop in the right eye, 3 times daily, for 7 days  Re-start amlodipine  5 mg -- 1 tablet daily If this is not affordable with [facility], I will send it to [facility] for you. You can make a free GoodRx account online, and get the prescription 2 month supply for about $10     ED Prescriptions     Medication Sig Dispense Auth. Provider   moxifloxacin  (VIGAMOX ) 0.5 % ophthalmic solution Place 1 drop into the right eye 3 (three) times daily for 7 days. 3 mL Jarrah Babich, PA-C   amLODipine  (NORVASC ) 5 MG tablet Take 1 tablet (5 mg total) by mouth daily. 60 tablet Adaijah Endres, Asberry, PA-C      PDMP not reviewed this encounter.   Jeryl Asberry, PA-C 07/15/24 1730

## 2024-08-28 ENCOUNTER — Emergency Department (HOSPITAL_COMMUNITY): Payer: Self-pay

## 2024-08-28 ENCOUNTER — Encounter (HOSPITAL_COMMUNITY): Payer: Self-pay

## 2024-08-28 ENCOUNTER — Other Ambulatory Visit (HOSPITAL_COMMUNITY): Payer: Self-pay

## 2024-08-28 ENCOUNTER — Other Ambulatory Visit: Payer: Self-pay

## 2024-08-28 ENCOUNTER — Emergency Department (HOSPITAL_COMMUNITY)
Admission: EM | Admit: 2024-08-28 | Discharge: 2024-08-28 | Disposition: A | Discharge status: Home / Self Care | Payer: Self-pay

## 2024-08-28 DIAGNOSIS — S0990XA Unspecified injury of head, initial encounter: Secondary | ICD-10-CM | POA: Insufficient documentation

## 2024-08-28 DIAGNOSIS — I10 Essential (primary) hypertension: Secondary | ICD-10-CM | POA: Insufficient documentation

## 2024-08-28 DIAGNOSIS — N390 Urinary tract infection, site not specified: Secondary | ICD-10-CM | POA: Insufficient documentation

## 2024-08-28 DIAGNOSIS — W06XXXA Fall from bed, initial encounter: Secondary | ICD-10-CM | POA: Insufficient documentation

## 2024-08-28 DIAGNOSIS — E119 Type 2 diabetes mellitus without complications: Secondary | ICD-10-CM | POA: Insufficient documentation

## 2024-08-28 DIAGNOSIS — Z794 Long term (current) use of insulin: Secondary | ICD-10-CM | POA: Insufficient documentation

## 2024-08-28 DIAGNOSIS — Z79899 Other long term (current) drug therapy: Secondary | ICD-10-CM | POA: Insufficient documentation

## 2024-08-28 DIAGNOSIS — Z7984 Long term (current) use of oral hypoglycemic drugs: Secondary | ICD-10-CM | POA: Insufficient documentation

## 2024-08-28 LAB — CBC WITH DIFFERENTIAL/PLATELET
Abs Immature Granulocytes: 0.03 K/uL (ref 0.00–0.07)
Basophils Absolute: 0 K/uL (ref 0.0–0.1)
Basophils Relative: 1 %
Eosinophils Absolute: 0.2 K/uL (ref 0.0–0.5)
Eosinophils Relative: 4 %
HCT: 29.8 % — ABNORMAL LOW (ref 36.0–46.0)
Hemoglobin: 10.5 g/dL — ABNORMAL LOW (ref 12.0–15.0)
Immature Granulocytes: 1 %
Lymphocytes Relative: 40 %
Lymphs Abs: 1.5 K/uL (ref 0.7–4.0)
MCH: 32.6 pg (ref 26.0–34.0)
MCHC: 35.2 g/dL (ref 30.0–36.0)
MCV: 92.5 fL (ref 80.0–100.0)
Monocytes Absolute: 0.3 K/uL (ref 0.1–1.0)
Monocytes Relative: 7 %
Neutro Abs: 1.8 K/uL (ref 1.7–7.7)
Neutrophils Relative %: 47 %
Platelets: 166 K/uL (ref 150–400)
RBC: 3.22 MIL/uL — ABNORMAL LOW (ref 3.87–5.11)
RDW: 12.7 % (ref 11.5–15.5)
WBC: 3.8 K/uL — ABNORMAL LOW (ref 4.0–10.5)
nRBC: 0 % (ref 0.0–0.2)

## 2024-08-28 LAB — I-STAT CHEM 8, ED
BUN: 15 mg/dL (ref 6–20)
Calcium, Ion: 1.18 mmol/L (ref 1.15–1.40)
Chloride: 103 mmol/L (ref 98–111)
Creatinine, Ser: 0.9 mg/dL (ref 0.44–1.00)
Glucose, Bld: 357 mg/dL — ABNORMAL HIGH (ref 70–99)
HCT: 47 % — ABNORMAL HIGH (ref 36.0–46.0)
Hemoglobin: 16 g/dL — ABNORMAL HIGH (ref 12.0–15.0)
Potassium: 4 mmol/L (ref 3.5–5.1)
Sodium: 138 mmol/L (ref 135–145)
TCO2: 23 mmol/L (ref 22–32)

## 2024-08-28 LAB — URINALYSIS, ROUTINE W REFLEX MICROSCOPIC
Bilirubin Urine: NEGATIVE
Glucose, UA: >=500 mg/dL — AB
Ketones, ur: NEGATIVE mg/dL
Nitrite: POSITIVE — AB
Protein, ur: NEGATIVE mg/dL
Specific Gravity, Urine: 1.025 (ref 1.005–1.030)
pH: 6 (ref 5.0–8.0)

## 2024-08-28 LAB — URINALYSIS, MICROSCOPIC (REFLEX)

## 2024-08-28 LAB — LIPASE, BLOOD: Lipase: 45 U/L (ref 11–51)

## 2024-08-28 MED ORDER — PROCHLORPERAZINE EDISYLATE 10 MG/2ML IJ SOLN
10.0000 mg | Freq: Once | INTRAMUSCULAR | Status: AC
  Administered 2024-08-28: 10 mg via INTRAVENOUS
  Filled 2024-08-28: qty 2

## 2024-08-28 MED ORDER — DIPHENHYDRAMINE HCL 50 MG/ML IJ SOLN
25.0000 mg | Freq: Once | INTRAMUSCULAR | Status: AC
  Administered 2024-08-28: 25 mg via INTRAVENOUS
  Filled 2024-08-28: qty 1

## 2024-08-28 MED ORDER — LORAZEPAM 2 MG/ML IJ SOLN
1.0000 mg | Freq: Once | INTRAMUSCULAR | Status: DC
  Filled 2024-08-28: qty 1

## 2024-08-28 MED ORDER — CEPHALEXIN 500 MG PO CAPS
500.0000 mg | ORAL_CAPSULE | Freq: Two times a day (BID) | ORAL | 0 refills | Status: DC
  Filled 2024-08-28: qty 10, 5d supply, fill #0

## 2024-08-28 MED ORDER — AMLODIPINE BESYLATE 5 MG PO TABS
5.0000 mg | ORAL_TABLET | Freq: Every day | ORAL | Status: DC
  Administered 2024-08-28: 5 mg via ORAL
  Filled 2024-08-28: qty 1

## 2024-08-28 NOTE — ED Triage Notes (Signed)
 Patient in ED with complaints of hitting head, and abdomen after falling out of bed at 1 am this morning.

## 2024-08-28 NOTE — ED Notes (Signed)
 Pt endorses Hx of HTN & has missed her morning does of Amlodipine  today. BP 213/114 then re-checked at 196/118 in triage.

## 2024-08-28 NOTE — Discharge Instructions (Signed)
 Please take the antibiotic for UTI.  You did not want to get the CT scans so I cannot say for sure that you sustained any serious injuries.  Please keep a close eye on your symptoms.  If you feel worse please return to the emergency department immediately for reevaluation.

## 2024-08-28 NOTE — ED Provider Notes (Signed)
 [place] EMERGENCY DEPARTMENT AT [facility] Provider Note   CSN: [id number] Arrival date & time: 08/28/24  1251     Patient presents with: No chief complaint on file.   Kathleen Stout is a 53 y.o. female.   53 year old female past medical history of hypertension, diabetes, and hyperlipidemia presenting to the emergency department today with headache and abdominal pain.  Patient states this started after she fell out of bed.  She reports that she has been having a frontal headache since then with some nausea.  The patient states that she thinks that she briefly lost consciousness and did see stars.  She came to the emergency department today due to these ongoing symptoms.  She states that she is also having pain over her upper abdomen and specifically left upper quadrant since she fell.  She has not taken her blood pressure medication this morning.  Denies any focal weakness, numbness, or tingling.        Prior to Admission medications   Medication Sig Start Date End Date Taking? Authorizing Provider  cephALEXin (KEFLEX) 500 MG capsule Take 1 capsule (500 mg total) by mouth 2 (two) times daily. 08/28/24  Yes Ula Prentice SAUNDERS, MD  Accu-Chek Softclix Lancets lancets Use as instructed. Inject into the skin twice daily/ ICD 10 E11.65 04/12/21   Fleming, Zelda W, NP  amLODipine  (NORVASC ) 5 MG tablet Take 1 tablet (5 mg total) by mouth daily. 07/15/24   Rising, Asberry, PA-C  azithromycin  (ZITHROMAX  Z-PAK) 250 MG tablet 2 today, then one every day x 4 days 10/16/23   Rodriguez-Southworth, Kyra, PA-C  benzonatate  (TESSALON ) 200 MG capsule Take 1 capsule (200 mg total) by mouth 3 (three) times daily as needed for cough. 10/16/23   Rodriguez-Southworth, Kyra, PA-C  Blood Glucose Monitoring Suppl (ONETOUCH VERIO) w/Device KIT Please provide patient with insurance approved GLUCOMETER ICD 10 E11.65 04/12/21   Theotis Haze ORN, NP  Dulaglutide  (TRULICITY ) 1.5 MG/0.5ML SOPN Inject 1.5 mg  into the skin once a week. 05/20/21   Newlin, Enobong, MD  fluticasone -salmeterol (ADVAIR HFA) 115-21 MCG/ACT inhaler Inhale 2 puffs into the lungs 2 (two) times daily. 07/21/22   Raspet, Erin K, PA-C  glucose blood test strip Use as instructed. Inject into the skin twice daily. E11.65 04/12/21   Theotis Haze ORN, NP  Insulin  Pen Needle (B-D UF III MINI PEN NEEDLES) 31G X 5 MM MISC Use as instructed. Inject into the skin once nightly. 04/12/21   Fleming, Zelda W, NP  metFORMIN  (GLUCOPHAGE -XR) 500 MG 24 hr tablet Take 2 tablets by mouth after your largest meal x1 week. Then, increase to 2 tablets in the morning and 2 in the evening. 05/06/21   Newlin, Enobong, MD  ondansetron  (ZOFRAN ) 4 MG tablet Take 1 tablet (4 mg total) by mouth every 6 (six) hours as needed for nausea or vomiting. 06/15/21   Hazen Darryle BRAVO, FNP  simvastatin  (ZOCOR ) 20 MG tablet Take 1 tablet (20 mg total) by mouth at bedtime. 04/12/21 07/11/21  Fleming, Zelda W, NP    Allergies: Patient has no known allergies.    Review of Systems  Gastrointestinal:  Positive for abdominal pain.  Neurological:  Positive for headaches.  All other systems reviewed and are negative.   Updated Vital Signs BP (!) 140/64 (BP Location: Right Arm)   Pulse 66   Temp 98 F (36.7 C) (Oral)   Resp 16   Ht 5' 4 (1.626 m)   Wt 106.6 kg   SpO2 99%  BMI 40.34 kg/m   Physical Exam Vitals and nursing note reviewed.   Gen: NAD Eyes: PERRL, EOMI HEENT: no oropharyngeal swelling Neck: trachea midline, no cervical spine tenderness, no stepoffs or deformities Resp: clear to auscultation bilaterally Card: RRR, no murmurs, rubs, or gallops Abd: The patient is tender over the epigastrium left upper quadrant with no guarding or rebound Extremities: no calf tenderness, no edema MSK: no thoracic spinal tenderness, no lumbar spinal tenderness, no step-offs or deformities Vascular: 2+ radial pulses bilaterally, 2+ DP pulses bilaterally Neuro: Alert and  oriented x 3, equal strength sensation throughout bilateral upper and lower extremities Skin: no rashes    (all labs ordered are listed, but only abnormal results are displayed) Labs Reviewed  CBC WITH DIFFERENTIAL/PLATELET - Abnormal; Notable for the following components:      Result Value   WBC 3.8 (*)    RBC 3.22 (*)    Hemoglobin 10.5 (*)    HCT 29.8 (*)    All other components within normal limits  URINALYSIS, ROUTINE W REFLEX MICROSCOPIC - Abnormal; Notable for the following components:   APPearance CLOUDY (*)    Glucose, UA >=500 (*)    Hgb urine dipstick TRACE (*)    Nitrite POSITIVE (*)    Leukocytes,Ua SMALL (*)    All other components within normal limits  URINALYSIS, MICROSCOPIC (REFLEX) - Abnormal; Notable for the following components:   Bacteria, UA MANY (*)    All other components within normal limits  I-STAT CHEM 8, ED - Abnormal; Notable for the following components:   Glucose, Bld 357 (*)    Hemoglobin 16.0 (*)    HCT 47.0 (*)    All other components within normal limits  LIPASE, BLOOD    EKG: None  Radiology: DG Chest Portable 1 View Result Date: 08/28/2024 CLINICAL DATA:  Fall. EXAM: PORTABLE CHEST 1 VIEW COMPARISON:  10/16/2023 FINDINGS: Heart size is within normal limits for technique. Mediastinal contours are within normal limits. Similar linear scarring/atelectasis in the right mid lung. No focal consolidation, pleural effusion, or pneumothorax. No acute osseous abnormality. IMPRESSION: No acute findings in the chest. Electronically Signed   By: Harrietta Sherry M.D.   On: 08/28/2024 14:51     Procedures   Medications Ordered in the ED  amLODipine  (NORVASC ) tablet 5 mg (5 mg Oral Given 08/28/24 1339)  LORazepam (ATIVAN) injection 1 mg (1 mg Intravenous Not Given 08/28/24 1600)  prochlorperazine (COMPAZINE) injection 10 mg (10 mg Intravenous Given 08/28/24 1500)  diphenhydrAMINE (BENADRYL) injection 25 mg (25 mg Intravenous Given 08/28/24 1500)                                     Medical Decision Making 53 year old female with past medical history of diabetes, hypertension, and hyperlipidemia presenting to the emergency department today with headache and upper abdominal pain.  I will further evaluate the patient here with basic labs including LFTs and lipase to evaluate for hepatobiliary pathology or pancreatitis.  Also obtain a chest x-ray, CT scan of her head, and CT abdomen further evaluation for acute traumatic injuries such as splenic injury or intracranial hemorrhage.  The patient's blood pressure is significantly elevated here but she did not take her blood pressure medication.  She is given amlodipine  here.  Will give her Compazine and Benadryl for headache and reevaluate for ultimate disposition.  Patient's labs are reassuring.  Her blood pressure on repeat  is 140/87.  The patient states she is feeling much better.  She states that she was feeling claustrophobic when she went over to the CT scanner.  I did offer her medications for this and she declined.  She states that she is feeling better and would like to try to go home.  I did explain to the patient with that without the CT imaging I could not rule out any internal bleeding or significant injuries.  She is aware of this.  She does have capacity to make her own medical decisions.  She is requesting discharge.  She is ultimately discharged through shared decision making and I have encouraged her to return to the emergency department immediately for worsening symptoms.  Urinalysis does have a lot of epi squames but is positive for nitrites, leukocytes and bacteria.  Will start the patient on a short course of antibiotics for potential UTI.  She is discharged with return precautions.  Amount and/or Complexity of Data Reviewed Radiology: ordered.  Risk Prescription drug management.        Final diagnoses:  Closed head injury, initial encounter  Urinary tract infection without  hematuria, site unspecified    ED Discharge Orders          Ordered    cephALEXin (KEFLEX) 500 MG capsule  2 times daily        08/28/24 1650               Ula Prentice SAUNDERS, MD 08/28/24 1652

## 2024-08-28 NOTE — ED Provider Triage Note (Signed)
 Emergency Medicine Provider Triage Evaluation Note  Kathleen Stout , a 53 y.o. female  was evaluated in triage.  Pt complains of headache, blurred vision, abdominal discomfort after falling from her bed this morning.  Reports some nausea but no vomiting has been tolerating oral intake well with last oral intake approximately 1 to 2 hours prior to arrival.  She has a history of hypertension, has not taken her regular schedule amlodipine , is noted to be hypertensive on assessment.  Also complains of photophobia..  Review of Systems  Positive: As above Negative:   Physical Exam  BP (!) 196/118   Pulse 82   Temp 97.8 F (36.6 C)   Resp 17   Ht 5' 4 (1.626 m)   Wt 106.6 kg   SpO2 93%   BMI 40.34 kg/m  Gen:   Awake, no distress   Resp:  Normal effort  MSK:   Moves extremities without difficulty  Other:  Photophobia, pupils equal and reactive bilaterally.  Medical Decision Making  Medically screening exam initiated at 1:35 PM.  Appropriate orders placed.  Kathleen Stout was informed that the remainder of the evaluation will be completed by another provider, this initial triage assessment does not replace that evaluation, and the importance of remaining in the ED until their evaluation is complete.  Initial order set placed for management of her hypertension with previously prescribed medications as well as additional labs and imaging for same.   Myriam Dorn BROCKS, [place] 08/28/24 [phone]

## 2024-09-04 ENCOUNTER — Ambulatory Visit: Payer: Self-pay

## 2024-09-04 NOTE — Telephone Encounter (Signed)
 FYI Only or Action Required?: FYI only for provider.  Patient was last seen in primary care on  n/a.  Called Nurse Triage reporting Headache.  Symptoms began yesterday.  Interventions attempted: Nothing.  Symptoms are: unchanged.  Triage Disposition: Go to ED Now (or PCP Triage)  Patient/caregiver understands and will follow disposition?: Yes   Copied from CRM #[id number]. Topic: Clinical - Red Word Triage >> Sep 04, 2024 11:36 AM Tiffini S wrote: [facility] that prompted transfer to Nurse Triage: Patient called about headache- dizziness, loss of appetite, vision- very painful   Reason for Disposition  [1] Blood glucose > 240 mg/dL (86.6 mmol/L) AND [7] vomiting AND [3] unable to check for ketones (in blood or urine)  Answer Assessment - Initial Assessment Questions 1. LOCATION: Where does it hurt?      headache 2. ONSET: When did the headache start? (e.g., minutes, hours, days)      X weekend 3. PATTERN: Does the pain come and go, or has it been constant since it started?     constant 4. SEVERITY: How bad is the pain? and What does it keep you from doing?  (e.g., Scale 1-10; mild, moderate, or severe)     severe 5. RECURRENT SYMPTOM: Have you ever had headaches before? If Yes, ask: When was the last time? and What happened that time?      no 6. CAUSE: What do you think is causing the headache?     unknown 7. MIGRAINE: Have you been diagnosed with migraine headaches? If Yes, ask: Is this headache similar?      no 8. HEAD INJURY: Has there been any recent injury to your head?      Clemens out of bed and hit back of head of the floor 9. OTHER SYMPTOMS: Do you have any other symptoms? (e.g., fever, stiff neck, eye pain, sore throat, cold symptoms)     Blurry vision, dizziness, loss of balance at times and loss of appetitie 10. PREGNANCY: Is there any chance you are pregnant? When was your last menstrual period?       na  Answer Assessment - Initial  Assessment Questions 1. BLOOD GLUCOSE: What is your blood glucose level?      FSBS 332 fasting- yesterday 2. ONSET: When did you check the blood glucose?     yesterday 3. USUAL RANGE: What is your glucose level usually? (e.g., usual fasting morning value, usual evening value)     na 4. KETONES: Do you check for ketones (urine or blood test strips)? If Yes, ask: What does the test show now?      unknown 5. TYPE 1 or 2:  Do you know what type of diabetes you have?  (e.g., Type 1, Type 2, Gestational; doesn't know)      Type 2 6. INSULIN : Do you take insulin ? What type of insulin (s) do you use? What is the mode of delivery? (syringe, pen; injection or pump)?      no 7. DIABETES PILLS: Do you take any pills for your diabetes? If Yes, ask: Have you missed taking any pills recently?     Pt has not been on diabetes medication for some time - due to lack of insurance 8. OTHER SYMPTOMS: Do you have any symptoms? (e.g., fever, frequent urination, difficulty breathing, dizziness, weakness, vomiting)     Dizziness 9. PREGNANCY: Is there any chance you are pregnant? When was your last menstrual period?     Na  Nurse did ask pt about  BP readings and pt has not attempted to take. Nurse referred pt to ER.  Protocols used: Headache-A-AH, Diabetes - High Blood Sugar-A-AH

## 2024-09-09 ENCOUNTER — Other Ambulatory Visit (HOSPITAL_COMMUNITY): Payer: Self-pay

## 2024-12-01 ENCOUNTER — Ambulatory Visit (HOSPITAL_BASED_OUTPATIENT_CLINIC_OR_DEPARTMENT_OTHER): Payer: Self-pay

## 2024-12-01 ENCOUNTER — Encounter (HOSPITAL_BASED_OUTPATIENT_CLINIC_OR_DEPARTMENT_OTHER): Payer: Self-pay

## 2024-12-01 ENCOUNTER — Other Ambulatory Visit (HOSPITAL_COMMUNITY): Payer: Self-pay

## 2024-12-01 ENCOUNTER — Encounter (HOSPITAL_COMMUNITY): Payer: Self-pay

## 2024-12-01 VITALS — BP 160/100 | HR 82 | Ht 64.0 in | Wt 238.7 lb

## 2024-12-01 DIAGNOSIS — E119 Type 2 diabetes mellitus without complications: Secondary | ICD-10-CM

## 2024-12-01 DIAGNOSIS — Z7689 Persons encountering health services in other specified circumstances: Secondary | ICD-10-CM

## 2024-12-01 DIAGNOSIS — F419 Anxiety disorder, unspecified: Secondary | ICD-10-CM | POA: Diagnosis not present

## 2024-12-01 DIAGNOSIS — R8290 Unspecified abnormal findings in urine: Secondary | ICD-10-CM

## 2024-12-01 DIAGNOSIS — I10 Essential (primary) hypertension: Secondary | ICD-10-CM | POA: Diagnosis not present

## 2024-12-01 DIAGNOSIS — G4730 Sleep apnea, unspecified: Secondary | ICD-10-CM

## 2024-12-01 LAB — POCT URINALYSIS DIP (CLINITEK)
Blood, UA: NEGATIVE
Glucose, UA: >=1000 mg/dL — AB
Ketones, POC UA: NEGATIVE mg/dL
Leukocytes, UA: NEGATIVE
Nitrite, UA: NEGATIVE
Spec Grav, UA: 1.02
Urobilinogen, UA: 0.2 U/dL
pH, UA: 6

## 2024-12-01 MED ORDER — OZEMPIC (0.25 OR 0.5 MG/DOSE) 2 MG/3ML ~~LOC~~ SOPN
PEN_INJECTOR | SUBCUTANEOUS | 0 refills | Status: DC
  Filled 2024-12-01: qty 3, 30d supply, fill #0

## 2024-12-01 MED ORDER — AMLODIPINE BESYLATE 10 MG PO TABS
10.0000 mg | ORAL_TABLET | Freq: Every day | ORAL | 3 refills | Status: AC
  Filled 2024-12-01: qty 90, 90d supply, fill #0

## 2024-12-01 MED ORDER — HYDROXYZINE PAMOATE 25 MG PO CAPS
25.0000 mg | ORAL_CAPSULE | Freq: Three times a day (TID) | ORAL | 0 refills | Status: AC | PRN
  Filled 2024-12-01: qty 30, 10d supply, fill #0

## 2024-12-01 NOTE — Progress Notes (Signed)
 "   New Patient Office Visit  Subjective:   Kathleen Stout [birth year] 12/01/2024  Chief Complaint  Patient presents with   New Patient (Initial Visit)    Patient is here to get established with PCP. States she has a UTI. States her diabetes and hypertension has been causing weakness.    HPI: Kathleen Stout presents today to establish care at [facility] at [facility]. Introduced to publishing rights manager role and practice setting with verbalized understanding by patient.  All questions answered.   Last PCP: unsure Last annual physical: unsure Concerns: See below   States she had to stop all medication approximately a year ago due to loss of insurance.  T2DM: Denies any known complications regarding her diabetes but states her boyfriend recently had a stroke and so she wants to get her health back on track. States she used to be on insulin  and metformin  and is unsure if she was controlled or not.  Denies any open wounds/sores.  HTN: Denies any current headaches or changes in vision. States she has always had high blood pressure and was previously on amlodipine  5mg  daily. Does not know if her blood pressure was controlled at that time.  OSA: States she has never been formerly diagnosed with sleep apnea but has been told she stops breathing in her sleep. Patient also experiences daytime drowsiness and wakes up very tired in the morning.  Urinary Symptoms: States she has noticed an odor to her urine that started about 1 week prior. States starting last night/this morning she has also noticed a burning sensation. Denies recent antibiotic use.  Anxiety: States she was on Zoloft a long time ago but has not been on medication for anxiety in a long time. Patient currently sees a therapist but would like a referral to a psychiatrist. Denies SI/HI. States symptoms are intermittent and she can tell when she begins to get worked up due to her anxious thoughts of  bad things happening to her.   The following portions of the patient's history were reviewed and updated as appropriate: past medical history, past surgical history, family history, social history, allergies, medications, and problem list.   Patient Active Problem List   Diagnosis Date Noted   Wheezing 11/25/2018   Influenza B 11/25/2018   Pneumonia due to infectious organism 11/25/2018   Depression, recurrent 05/29/2018   Chest pressure    Chest pain 09/19/2016   Morbid obesity (HCC)    Type II diabetes mellitus (HCC)    Morbid obesity with BMI of 50.0-59.9, adult (HCC) 08/02/2015   Type 2 diabetes mellitus in patient with obesity (HCC) 08/02/2015   Essential hypertension 05/21/2015   Osteoarthritis of left knee 05/21/2015   Knee pain, left 04/20/2015   Past Medical History:  Diagnosis Date   Anemia    Anxiety    Arthritis    Chest pressure    a. 08/2016 Myoview : EF 68%, no ST changes, basal anterior/mid anterior defect - felt to be attenuation-->Low risk.   Depression    Essential hypertension    Morbid obesity (HCC)    Sleep apnea    Type II diabetes mellitus (HCC)    Past Surgical History:  Procedure Laterality Date   ABDOMINAL HYSTERECTOMY     CESAREAN SECTION     Family History  Problem Relation Age of Onset   Diabetes Mother    Hypertension Mother    Hyperlipidemia Mother    Heart disease Mother    Aneurysm Mother  70   Healthy Sister    Soil Scientist Sister    Healthy Sister    Healthy Sister    Diabetes Brother    Hypertension Brother    Healthy Brother    Healthy Brother    Healthy Brother    Healthy Brother    Healthy Brother    Healthy Brother    Healthy Brother    Healthy Brother    Anxiety disorder Child    Social History   Socioeconomic History   Marital status: Single    Spouse name: Not on file   Number of children: Not on file   Years of education: Not on file   Highest education level: 10th grade  Occupational  History   Occupation: Housekeeper  Tobacco Use   Smoking status: Never   Smokeless tobacco: Never  Vaping Use   Vaping status: Never Used  Substance and Sexual Activity   Alcohol use: Never   Drug use: Never   Sexual activity: Not Currently  Other Topics Concern   Not on file  Social History Narrative   Does not routinely exercise.  Regularly eats fast food, especially Jodie [employer] and Chinese food.  Originally from [place], [place] - moved to [place] in 2015.   Social Drivers of Health   Tobacco Use: Low Risk (12/01/2024)   Patient History    Smoking Tobacco Use: Never    Smokeless Tobacco Use: Never    Passive Exposure: Not on file  Financial Resource Strain: Low Risk (11/28/2024)   Overall Financial Resource Strain (CARDIA)    Difficulty of Paying Living Expenses: Not hard at all  Food Insecurity: No Food Insecurity (11/28/2024)   Epic    Worried About Radiation Protection Practitioner of Food in the Last Year: Never true    Ran Out of Food in the Last Year: Never true  Transportation Needs: No Transportation Needs (11/28/2024)   Epic    Lack of Transportation (Medical): No    Lack of Transportation (Non-Medical): No  Physical Activity: Inactive (11/28/2024)   Exercise Vital Sign    Days of Exercise per Week: 0 days    Minutes of Exercise per Session: Not on file  Stress: Stress Concern Present (11/28/2024)   [employer] of Occupational Health - Occupational Stress Questionnaire    Feeling of Stress: Very much  Social Connections: Socially Isolated (11/28/2024)   Social Connection and Isolation Panel    Frequency of Communication with Friends and Family: More than three times a week    Frequency of Social Gatherings with Friends and Family: More than three times a week    Attends Religious Services: Never    Database Administrator or Organizations: No    Attends Engineer, Structural: Not on file    Marital Status: Separated  Intimate Partner Violence: Not At Risk (12/01/2024)   Epic    Fear of  Current or Ex-Partner: No    Emotionally Abused: No    Physically Abused: No    Sexually Abused: No  Depression (PHQ2-9): High Risk (12/01/2024)   Depression (PHQ2-9)    PHQ-2 Score: 16  Alcohol Screen: Low Risk (11/28/2024)   Alcohol Screen    Last Alcohol Screening Score (AUDIT): 0  Housing: Low Risk (11/28/2024)   Epic    Unable to Pay for Housing in the Last Year: No    Number of Times Moved in the Last Year: 1    Homeless in the Last Year: No  Utilities: Not At Risk (12/01/2024)   Epic    Threatened with loss of utilities: No  Health Literacy: Adequate Health Literacy (12/01/2024)   B1300 Health Literacy    Frequency of need for help with medical instructions: Never    Allergies[1]  ROS: A complete ROS was performed with pertinent positives/negatives noted in the HPI. The remainder of the ROS are negative.   Objective:   Today's Vitals   12/01/24 1445 12/01/24 1535  BP: (!) 170/129 (!) 160/100  Pulse: 82   SpO2: 97%   Weight: 238 lb 11.2 oz (108.3 kg)   Height: 5' 4 (1.626 m)     GENERAL: Well-appearing, in NAD. Well nourished. RESPIRATORY: Chest wall symmetrical. Respirations even and non-labored. Breath sounds clear to auscultation bilaterally.  CARDIAC: S1, S2 present, regular rate and rhythm without murmur or gallops. Peripheral pulses 2+ bilaterally.  MSK: Muscle tone and strength appropriate for age. Joints w/o tenderness, redness, or swelling.  EXTREMITIES: Without clubbing, cyanosis, or edema.  NEUROLOGIC: No motor or sensory deficits. Steady, even gait. C2-C12 intact.  PSYCH/MENTAL STATUS: Alert, oriented x 3. Cooperative, appropriate mood and affect.    Health Maintenance Due  Topic Date Due   OPHTHALMOLOGY EXAM  Never done   Hepatitis B Vaccines 19-59 Average Risk (1 of 3 - 19+ 3-dose series) Never done   Cervical Cancer Screening (HPV/Pap Cotest)  Never done   Colonoscopy  Never done   FOOT EXAM  05/30/2019   Zoster Vaccines- Shingrix (1 of 2) Never done    Mammogram  07/19/2020   HEMOGLOBIN A1C  10/13/2021   Diabetic kidney evaluation - Urine ACR  04/12/2022   Diabetic kidney evaluation - eGFR measurement  09/13/2023   Pneumococcal Vaccine: 50+ Years (3 of 3 - PCV20 or PCV21) 06/04/2024   COVID-19 Vaccine (3 - 2025-26 season) 07/28/2024    No results found for any visits on 12/01/24.     Assessment & Plan:  1. Encounter to establish care with new doctor Discussed role of NP and expectations of the Primary Care Clinic. Discussed medical, surgical, and family history.   2. Essential hypertension (Primary) Discussed starting medication with her HTN. Discussed since she was uncontrolled on the 5mg  to go ahead and start taking 10mg . Discussed red flag symptoms and when to be seen in ER. Pt verbalized understanding. - amLODipine  (NORVASC ) 10 MG tablet; Take 1 tablet (10 mg total) by mouth daily.  Dispense: 90 tablet; Refill: 3 - Comprehensive metabolic panel with GFR  3. Type 2 diabetes mellitus without complication, without long-term current use of insulin  (HCC) Lab ordered today. Discussed going ahead and ordering her an injectable (Ozempic ) as I expect her to have an elevated A1C. However, I discussed waiting to pick up her medication until lab results. Patient verbalized understanding. - Hemoglobin A1c - Semaglutide ,0.25 or 0.5MG /DOS, (OZEMPIC , 0.25 OR 0.5 MG/DOSE,) 2 MG/3ML SOPN; Inject 0.25 mg into the skin once a week for 30 days, THEN 0.5 mg once a week for 30 days, THEN 0.5 mg once a week.  Dispense: 3 mL; Refill: 0  4. Anxiety Discussed referral for psychiatry as patient states she has family history of bipolar and would like to speak to someone about that further. Ordered a PRN medication for anxiety and patient is to follow up with PCP if medication is not helpful. - hydrOXYzine  (VISTARIL ) 25 MG capsule; Take 1 capsule (25 mg total) by mouth every 8 (eight) hours as needed for anxiety.  Dispense: 30 capsule; Refill: 0 -  Ambulatory  referral to Psychiatry  5. Sleep apnea, unspecified type Referral placed. - Ambulatory referral to Pulmonology  6. Foul smelling urine Negative for UTI in clinic. Discussed close follow up is symptoms are unresolved. Also discussed that uncontrolled blood sugar levels can create the urinary symptoms she is experiencing. Pt verbalized understanding and will follow with PCP. - POCT URINALYSIS DIP (CLINITEK)   Patient to reach out to office if new, worrisome, or unresolved symptoms arise or if no improvement in patient's condition. Patient verbalized understanding and is agreeable to treatment plan. All questions answered to patient's satisfaction.    Return in about 8 days (around 12/09/2024) for annual physical (fasting labs) & BP recheck.    Lauraine Almarie Angus DNP, FNP-C      [1] No Known Allergies  "

## 2024-12-01 NOTE — Patient Instructions (Signed)
 You are negative for a UTI.  Sometimes these symptoms can be present in the setting of uncontrolled diabetes. I would like for you to keep a close eye on your symptoms. If they change or get worse please reach out.

## 2024-12-02 ENCOUNTER — Other Ambulatory Visit (HOSPITAL_COMMUNITY): Payer: Self-pay

## 2024-12-02 ENCOUNTER — Ambulatory Visit (HOSPITAL_BASED_OUTPATIENT_CLINIC_OR_DEPARTMENT_OTHER): Payer: Self-pay

## 2024-12-02 LAB — COMPREHENSIVE METABOLIC PANEL WITH GFR
ALT: 14 IU/L (ref 0–32)
AST: 15 IU/L (ref 0–40)
Albumin: 4.3 g/dL (ref 3.8–4.9)
Alkaline Phosphatase: 84 IU/L (ref 49–135)
BUN/Creatinine Ratio: 18 (ref 9–23)
BUN: 19 mg/dL (ref 6–24)
Bilirubin Total: 0.3 mg/dL (ref 0.0–1.2)
CO2: 22 mmol/L (ref 20–29)
Calcium: 9.8 mg/dL (ref 8.7–10.2)
Chloride: 99 mmol/L (ref 96–106)
Creatinine, Ser: 1.07 mg/dL — ABNORMAL HIGH (ref 0.57–1.00)
Globulin, Total: 2.6 g/dL (ref 1.5–4.5)
Glucose: 315 mg/dL — ABNORMAL HIGH (ref 70–99)
Potassium: 4.5 mmol/L (ref 3.5–5.2)
Sodium: 138 mmol/L (ref 134–144)
Total Protein: 6.9 g/dL (ref 6.0–8.5)
eGFR: 62 mL/min/1.73

## 2024-12-02 LAB — HEMOGLOBIN A1C
Est. average glucose Bld gHb Est-mCnc: 295 mg/dL
Hgb A1c MFr Bld: 11.9 % — ABNORMAL HIGH (ref 4.8–5.6)

## 2024-12-02 NOTE — Progress Notes (Signed)
 Hi Kathleen Stout, Your A1C is elevated at 11 which is what we expected. Please begin taking the Ozempic  & Amlodipine  and we will follow up next week for you physical!  Lauraine Almarie Angus DNP, FNP-C

## 2024-12-03 ENCOUNTER — Other Ambulatory Visit (HOSPITAL_COMMUNITY): Payer: Self-pay

## 2024-12-08 ENCOUNTER — Emergency Department (HOSPITAL_COMMUNITY)

## 2024-12-08 ENCOUNTER — Other Ambulatory Visit: Payer: Self-pay

## 2024-12-08 ENCOUNTER — Encounter (HOSPITAL_COMMUNITY): Payer: Self-pay

## 2024-12-08 ENCOUNTER — Emergency Department (HOSPITAL_COMMUNITY)
Admission: EM | Admit: 2024-12-08 | Discharge: 2024-12-08 | Discharge status: Against Medical Advice | Attending: Emergency Medicine | Admitting: Emergency Medicine

## 2024-12-08 DIAGNOSIS — Z5321 Procedure and treatment not carried out due to patient leaving prior to being seen by health care provider: Secondary | ICD-10-CM | POA: Diagnosis not present

## 2024-12-08 DIAGNOSIS — R0602 Shortness of breath: Secondary | ICD-10-CM | POA: Insufficient documentation

## 2024-12-08 DIAGNOSIS — R112 Nausea with vomiting, unspecified: Secondary | ICD-10-CM | POA: Insufficient documentation

## 2024-12-08 DIAGNOSIS — R630 Anorexia: Secondary | ICD-10-CM | POA: Insufficient documentation

## 2024-12-08 DIAGNOSIS — M7918 Myalgia, other site: Secondary | ICD-10-CM | POA: Diagnosis not present

## 2024-12-08 DIAGNOSIS — R6883 Chills (without fever): Secondary | ICD-10-CM | POA: Diagnosis not present

## 2024-12-08 DIAGNOSIS — R531 Weakness: Secondary | ICD-10-CM | POA: Diagnosis not present

## 2024-12-08 DIAGNOSIS — I10 Essential (primary) hypertension: Secondary | ICD-10-CM | POA: Insufficient documentation

## 2024-12-08 LAB — CBC
HCT: 46.6 % — ABNORMAL HIGH (ref 36.0–46.0)
Hemoglobin: 16.1 g/dL — ABNORMAL HIGH (ref 12.0–15.0)
MCH: 29.4 pg (ref 26.0–34.0)
MCHC: 34.5 g/dL (ref 30.0–36.0)
MCV: 85.2 fL (ref 80.0–100.0)
Platelets: 205 K/uL (ref 150–400)
RBC: 5.47 MIL/uL — ABNORMAL HIGH (ref 3.87–5.11)
RDW: 12.5 % (ref 11.5–15.5)
WBC: 7.5 K/uL (ref 4.0–10.5)
nRBC: 0 % (ref 0.0–0.2)

## 2024-12-08 LAB — URINALYSIS, ROUTINE W REFLEX MICROSCOPIC
Bilirubin Urine: NEGATIVE
Glucose, UA: 150 mg/dL — AB
Hgb urine dipstick: NEGATIVE
Ketones, ur: 5 mg/dL — AB
Nitrite: NEGATIVE
Protein, ur: 30 mg/dL — AB
Specific Gravity, Urine: 1.019 (ref 1.005–1.030)
WBC, UA: >50 WBC/hpf (ref 0–5)
pH: 5 (ref 5.0–8.0)

## 2024-12-08 LAB — COMPREHENSIVE METABOLIC PANEL WITH GFR
ALT: 30 U/L (ref 0–44)
AST: 26 U/L (ref 15–41)
Albumin: 3.9 g/dL (ref 3.5–5.0)
Alkaline Phosphatase: 87 U/L (ref 38–126)
Anion gap: 13 (ref 5–15)
BUN: 16 mg/dL (ref 6–20)
CO2: 25 mmol/L (ref 22–32)
Calcium: 9.4 mg/dL (ref 8.9–10.3)
Chloride: 100 mmol/L (ref 98–111)
Creatinine, Ser: 1.12 mg/dL — ABNORMAL HIGH (ref 0.44–1.00)
GFR, Estimated: 58 mL/min — ABNORMAL LOW
Glucose, Bld: 270 mg/dL — ABNORMAL HIGH (ref 70–99)
Potassium: 3.5 mmol/L (ref 3.5–5.1)
Sodium: 137 mmol/L (ref 135–145)
Total Bilirubin: 0.5 mg/dL (ref 0.0–1.2)
Total Protein: 7.6 g/dL (ref 6.5–8.1)

## 2024-12-08 LAB — RESP PANEL BY RT-PCR (RSV, FLU A&B, COVID)  RVPGX2
Influenza A by PCR: NEGATIVE
Influenza B by PCR: NEGATIVE
Resp Syncytial Virus by PCR: NEGATIVE
SARS Coronavirus 2 by RT PCR: NEGATIVE

## 2024-12-08 LAB — LIPASE, BLOOD: Lipase: 18 U/L (ref 11–51)

## 2024-12-08 MED ORDER — METOCLOPRAMIDE HCL 10 MG PO TABS
10.0000 mg | ORAL_TABLET | Freq: Once | ORAL | Status: AC
  Administered 2024-12-08: 10 mg via ORAL
  Filled 2024-12-08: qty 1

## 2024-12-08 NOTE — ED Triage Notes (Signed)
 PT arrived POV, CC complaint of weakness, elevated BP and chills. PT states S/s started fri night, endorses chills, n/v, lack of appetite. PT states also began Ozempic  on tue of last week.  PT aox4.

## 2024-12-08 NOTE — ED Notes (Signed)
 Pt called for role call x3 no response

## 2024-12-08 NOTE — ED Provider Triage Note (Signed)
 Emergency Medicine Provider Triage Evaluation Note  Kathleen Stout , a 54 y.o. female  was evaluated in triage.  Pt complains of multiple complaints  Review of Systems  Positive: Nausea, vomiting, chills, myalgias, exertional shortness of breath Negative: Chest pain, numbness, weakness  Physical Exam  BP (!) 174/100   Pulse 92   Temp 97.6 F (36.4 C)   Resp 16   Ht 5' 4 (1.626 m)   Wt 108.4 kg   SpO2 100%   BMI 41.02 kg/m  Gen:   Awake, no distress   Resp:  Normal effort  MSK:   Moves extremities without difficulty     Medical Decision Making  Medically screening exam initiated at 9:42 AM.  Appropriate orders placed.  Kathleen Stout was informed that the remainder of the evaluation will be completed by another provider, this initial triage assessment does not replace that evaluation, and the importance of remaining in the ED until their evaluation is complete.  Patient presenting for multiple complaints.  3 days ago, she had onset of nausea, vomiting, and chills.  She has not vomited since that day, however, she has had ongoing nausea and decreased p.o. intake.  She has been able to drink a small amount of fluids.  She describes pain all over as well as exertional shortness of breath.  She did initiate Kathleen Stout  6 days ago.   Kathleen Motto, MD 12/08/24 [phone]

## 2024-12-08 NOTE — ED Notes (Signed)
 Pt called for vitals x3 no response.

## 2024-12-11 ENCOUNTER — Ambulatory Visit (INDEPENDENT_AMBULATORY_CARE_PROVIDER_SITE_OTHER)

## 2024-12-11 ENCOUNTER — Encounter (HOSPITAL_BASED_OUTPATIENT_CLINIC_OR_DEPARTMENT_OTHER): Payer: Self-pay

## 2024-12-11 VITALS — BP 136/86 | HR 86 | Ht 64.0 in | Wt 229.2 lb

## 2024-12-11 DIAGNOSIS — E119 Type 2 diabetes mellitus without complications: Secondary | ICD-10-CM

## 2024-12-11 DIAGNOSIS — Z1231 Encounter for screening mammogram for malignant neoplasm of breast: Secondary | ICD-10-CM

## 2024-12-11 DIAGNOSIS — B351 Tinea unguium: Secondary | ICD-10-CM | POA: Diagnosis not present

## 2024-12-11 DIAGNOSIS — I10 Essential (primary) hypertension: Secondary | ICD-10-CM | POA: Diagnosis not present

## 2024-12-11 DIAGNOSIS — Z0000 Encounter for general adult medical examination without abnormal findings: Secondary | ICD-10-CM | POA: Diagnosis not present

## 2024-12-11 DIAGNOSIS — Z1211 Encounter for screening for malignant neoplasm of colon: Secondary | ICD-10-CM | POA: Diagnosis not present

## 2024-12-11 NOTE — Progress Notes (Signed)
 "  Subjective:   Kathleen Stout December 31, 1970  12/11/2024   CC: Chief Complaint  Patient presents with   Annual Exam    Patient is here for her annual physical. Patient has D/V and chills 5 days ago, but now she feels better, she is just put of breath.    HPI: Kathleen Stout is a 54 y.o. female who presents for a routine health maintenance exam.  Labs collected at time of visit.   HYPERTENSION: Kathleen Stout presents for the medical management of hypertension.  Patient's current hypertension medication regimen is: Amlodipine  10mg  Patient is  currently taking prescribed medications for HTN.  Patient is not regularly keeping a check on BP at home.  Adhering to low sodium diet: yes Exercising Regularly: intermittently Denies headache, dizziness, CP, SHOB, vision changes.   BP Readings from Last 3 Encounters:  12/11/24 136/86  12/08/24 (!) 174/100  12/01/24 (!) 160/100     DIABETES MELLITUS: Kathleen Stout presents for the medical management of diabetes.  Current diabetes medication regimen: Ozempic  0.25mg  injection Patient is  adhering to a diabetic diet.  Patient is intermittently exercising regularly.  Patient is not checking BS regularly.  Patient is checking their feet regularly.  Denies polydipsia, polyphagia, polyuria, open wounds or ulcers on feet.  Lab Results  Component Value Date   HGBA1C 11.9 (H) 12/01/2024    Foot Exam: No foot exam found Lab Results  Component Value Date   LABMICR 4.6 04/12/2021   LABMICR 18.0 05/29/2018    Wt Readings from Last 3 Encounters:  12/11/24 229 lb 3.2 oz (104 kg)  12/08/24 239 lb (108.4 kg)  12/01/24 238 lb 11.2 oz (108.3 kg)   DM Foot Exam Color: normal Sensation Monofilament:normal right, left, and bilateral dorsal, plantar, toe, and distal Sensation Vibration:normal right, left, and bilateral dorsal, plantar, toe, and distal Circulation: Pulses normal right, left, and bilateral pedal and posterial tibial  Lesions:  normal dorsal, plantar, toe, and distal   Health Maintenance: States she has not had screenings in years and is in need of referrals/orders. She does report total hysterectomy so she has not had a pap smear. Denies any other needs. Believes vaccines are up to date.   Dentist: Is requesting resources of where to go.  HEALTH SCREENINGS: - Vision Screening: up to date - Dental Visits: resources given - Pap smear: not applicable - Breast Exam: up to date - STD Screening: Not applicable - Mammogram (40+): Ordered today  - Colonoscopy (45+): Ordered today  - Bone Density (65+ or under 65 with predisposing conditions): Not applicable  - Lung CA screening with low-dose CT:  Not applicable Adults age 36-80 who are current cigarette smokers or quit within the last 15 years. Must have 20 pack year history.   Depression and Anxiety Screen done today and results listed below:     12/01/2024    2:51 PM 04/12/2021    2:01 PM 05/29/2018    9:12 AM 03/15/2017    4:32 PM 06/08/2016    2:30 PM  Depression screen PHQ 2/9  Decreased Interest 3 0 1 0 3  Down, Depressed, Hopeless 1 1 3 1  0  PHQ - 2 Score 4 1 4 1 3   Altered sleeping 3 3 3 1  0  Tired, decreased energy 3 3 3 1 1   Change in appetite 3 0 3 0 0  Feeling bad or failure about yourself  1 0 3 0 0  Trouble concentrating 1 0 3 0 0  Moving  slowly or fidgety/restless 1 0 1 0 0  Suicidal thoughts 0 0 0 0  0   PHQ-9 Score 16 7  20  3  4    Difficult doing work/chores Somewhat difficult  Not difficult at all  Not difficult at all     Data saved with a previous flowsheet row definition      12/01/2024    2:53 PM 04/12/2021    2:01 PM 03/15/2017    4:33 PM 06/08/2016    2:30 PM  GAD 7 : Generalized Anxiety Score  Nervous, Anxious, on Edge 1 3 0 0  Control/stop worrying 3 3 1  0  Worry too much - different things 3 3 1  0  Trouble relaxing 3 3 1  0  Restless 1 0 0 0  Easily annoyed or irritable 3 3 1  0  Afraid - awful might happen 3 0 0 0  Total  GAD 7 Score 17 15 4  0  Anxiety Difficulty Somewhat difficult   Not difficult at all    IMMUNIZATIONS: - Tdap: Tetanus vaccination status reviewed: last tetanus booster within 10 years. - HPV: Up to date - Influenza: Up to date - Pneumovax: Refused - Prevnar 20: Refused - Shingrix (50+): Refused   Past medical history, surgical history, medications, allergies, family history and social history reviewed with patient today and changes made to appropriate areas of the chart.   Past Medical History:  Diagnosis Date   Anemia    Anxiety    Arthritis    Chest pressure    a. 08/2016 Myoview : EF 68%, no ST changes, basal anterior/mid anterior defect - felt to be attenuation-->Low risk.   Depression    Essential hypertension    Morbid obesity (HCC)    Sleep apnea    Type II diabetes mellitus (HCC)     Past Surgical History:  Procedure Laterality Date   ABDOMINAL HYSTERECTOMY     CESAREAN SECTION      Current Outpatient Medications on File Prior to Visit  Medication Sig   amLODipine  (NORVASC ) 10 MG tablet Take 1 tablet (10 mg total) by mouth daily.   hydrOXYzine  (VISTARIL ) 25 MG capsule Take 1 capsule (25 mg total) by mouth every 8 (eight) hours as needed for anxiety.   Semaglutide ,0.25 or 0.5MG /DOS, (OZEMPIC , 0.25 OR 0.5 MG/DOSE,) 2 MG/3ML SOPN Inject 0.25 mg into the skin once a week for 28 days, THEN 0.5 mg once a week.   Current Facility-Administered Medications on File Prior to Visit  Medication   technetium tetrofosmin  (TC-MYOVIEW ) injection 29.6 millicurie    Allergies[1]   Social History   Socioeconomic History   Marital status: Single    Spouse name: Not on file   Number of children: Not on file   Years of education: Not on file   Highest education level: 10th grade  Occupational History   Occupation: Housekeeper  Tobacco Use   Smoking status: Never   Smokeless tobacco: Never  Vaping Use   Vaping status: Never Used  Substance and Sexual Activity   Alcohol  use: Never   Drug use: Never   Sexual activity: Not Currently  Other Topics Concern   Not on file  Social History Narrative   Does not routinely exercise.  Regularly eats fast food, especially Jodie [employer] and Chinese food.  Originally from [place], [place] - moved to [place] in 2015.   Social Drivers of Health   Tobacco Use: Low Risk (12/11/2024)   Patient History    Smoking Tobacco Use:  Never    Smokeless Tobacco Use: Never    Passive Exposure: Not on file  Financial Resource Strain: Low Risk (11/28/2024)   Overall Financial Resource Strain (CARDIA)    Difficulty of Paying Living Expenses: Not hard at all  Food Insecurity: No Food Insecurity (11/28/2024)   Epic    Worried About Programme Researcher, Broadcasting/film/video in the Last Year: Never true    Ran Out of Food in the Last Year: Never true  Transportation Needs: No Transportation Needs (11/28/2024)   Epic    Lack of Transportation (Medical): No    Lack of Transportation (Non-Medical): No  Physical Activity: Inactive (11/28/2024)   Exercise Vital Sign    Days of Exercise per Week: 0 days    Minutes of Exercise per Session: Not on file  Stress: Stress Concern Present (11/28/2024)   [employer] of Occupational Health - Occupational Stress Questionnaire    Feeling of Stress: Very much  Social Connections: Socially Isolated (11/28/2024)   Social Connection and Isolation Panel    Frequency of Communication with Friends and Family: More than three times a week    Frequency of Social Gatherings with Friends and Family: More than three times a week    Attends Religious Services: Never    Database Administrator or Organizations: No    Attends Engineer, Structural: Not on file    Marital Status: Separated  Intimate Partner Violence: Not At Risk (12/01/2024)   Epic    Fear of Current or Ex-Partner: No    Emotionally Abused: No    Physically Abused: No    Sexually Abused: No  Depression (PHQ2-9): High Risk (12/01/2024)   Depression (PHQ2-9)    PHQ-2 Score: 16   Alcohol Screen: Low Risk (11/28/2024)   Alcohol Screen    Last Alcohol Screening Score (AUDIT): 0  Housing: Low Risk (11/28/2024)   Epic    Unable to Pay for Housing in the Last Year: No    Number of Times Moved in the Last Year: 1    Homeless in the Last Year: No  Utilities: Not At Risk (12/01/2024)   Epic    Threatened with loss of utilities: No  Health Literacy: Adequate Health Literacy (12/01/2024)   B1300 Health Literacy    Frequency of need for help with medical instructions: Never   Tobacco Use History[2] Social History   Substance and Sexual Activity  Alcohol Use Never    Family History  Problem Relation Age of Onset   Diabetes Mother    Hypertension Mother    Hyperlipidemia Mother    Heart disease Mother    Aneurysm Mother 35   Healthy Sister    Audiological Scientist    Healthy Sister    Healthy Sister    Healthy Sister    Diabetes Brother    Hypertension Brother    Healthy Brother    Healthy Brother    Healthy Brother    Healthy Brother    Healthy Brother    Healthy Brother    Healthy Brother    Healthy Brother    Anxiety disorder Child      ROS: Denies fever, fatigue, unexplained weight loss/gain, chest pain, SHOB, and palpitations. Denies neurological deficits, gastrointestinal or genitourinary complaints, and skin changes.   Objective:   Today's Vitals   12/11/24 0856  BP: 136/86  Pulse: 86  SpO2: 96%  Weight: 229 lb 3.2 oz (104 kg)  Height: 5' 4 (1.626 m)    GENERAL APPEARANCE:  Well-appearing, in NAD. Well nourished. SKIN: Pink, warm and dry. Turgor normal. No rash, lesion, ulceration, or ecchymoses. Hair evenly distributed.  HEENT: HEAD: Normocephalic.  EYES: PERRLA. EOMI. Lids intact w/o defect. Sclera white, Conjunctiva pink w/o exudate.  EARS: External ear w/o redness, swelling, masses or lesions. EAC clear. TM's intact, translucent w/o bulging, appropriate landmarks visualized. Appropriate acuity to conversational tones.  NOSE: Septum midline  w/o deformity. Nares patent, mucosa pink and non-inflamed w/o drainage. No sinus tenderness.  THROAT: Uvula midline. Oropharynx clear. Tonsils non-inflamed w/o exudate. Oral mucosa pink and moist.  NECK: Supple, Trachea midline. Full ROM w/o pain or tenderness. No lymphadenopathy. Thyroid non-tender w/o enlargement or palpable masses. BREASTS: Breasts pendulous, symmetrical, and w/o palpable masses. Nipples everted and w/o discharge. No rash or skin retraction. No axillary or supraclavicular lymphadenopathy. RESPIRATORY: Chest wall symmetrical w/o masses. Respirations even and non-labored. Breath sounds clear to auscultation bilaterally. No wheezes, rales, rhonchi, or crackles. CARDIAC: S1, S2 present, regular rate and rhythm. No gallops, murmurs, rubs, or clicks. PMI w/o lifts, heaves, or thrills. No carotid bruits. Capillary refill <2 seconds. Peripheral pulses 2+ bilaterally. GI: Abdomen soft w/o distention. Normoactive bowel sounds. No palpable masses or tenderness. No guarding or rebound tenderness. Liver and spleen w/o tenderness or enlargement. No CVA tenderness.  GU: deferred.  MSK: Muscle tone and strength appropriate for age, w/o atrophy or abnormal movement.  EXTREMITIES: Active ROM intact, w/o tenderness, crepitus, or contracture. No obvious joint deformities or effusions. No clubbing, edema, or cyanosis.  NEUROLOGIC: CN's II-XII intact. Motor strength symmetrical with no obvious weakness. No sensory deficits. DTR's 2+ symmetric bilaterally. Steady, even gait.  PSYCH/MENTAL STATUS: Alert, oriented x 3. Cooperative, appropriate mood and affect.    Results for orders placed or performed during the hospital encounter of 12/08/24  Lipase, blood   Collection Time: 12/08/24  8:53 AM  Result Value Ref Range   Lipase 18 11 - 51 U/L  Comprehensive metabolic panel   Collection Time: 12/08/24  8:53 AM  Result Value Ref Range   Sodium 137 135 - 145 mmol/L   Potassium 3.5 3.5 - 5.1 mmol/L    Chloride 100 98 - 111 mmol/L   CO2 25 22 - 32 mmol/L   Glucose, Bld 270 (H) 70 - 99 mg/dL   BUN 16 6 - 20 mg/dL   Creatinine, Ser 8.87 (H) 0.44 - 1.00 mg/dL   Calcium 9.4 8.9 - 89.6 mg/dL   Total Protein 7.6 6.5 - 8.1 g/dL   Albumin 3.9 3.5 - 5.0 g/dL   AST 26 15 - 41 U/L   ALT 30 0 - 44 U/L   Alkaline Phosphatase 87 38 - 126 U/L   Total Bilirubin 0.5 0.0 - 1.2 mg/dL   GFR, Estimated 58 (L) >60 mL/min   Anion gap 13 5 - 15  CBC   Collection Time: 12/08/24  8:53 AM  Result Value Ref Range   WBC 7.5 4.0 - 10.5 K/uL   RBC 5.47 (H) 3.87 - 5.11 MIL/uL   Hemoglobin 16.1 (H) 12.0 - 15.0 g/dL   HCT 53.3 (H) 63.9 - 53.9 %   MCV 85.2 80.0 - 100.0 fL   MCH 29.4 26.0 - 34.0 pg   MCHC 34.5 30.0 - 36.0 g/dL   RDW 87.4 88.4 - 84.4 %   Platelets 205 150 - 400 K/uL   nRBC 0.0 0.0 - 0.2 %  Resp panel by RT-PCR (RSV, Flu A&B, Covid) Anterior Nasal Swab   Collection Time: 12/08/24  9:45 AM   Specimen: Anterior Nasal Swab  Result Value Ref Range   SARS Coronavirus 2 by RT PCR NEGATIVE NEGATIVE   Influenza A by PCR NEGATIVE NEGATIVE   Influenza B by PCR NEGATIVE NEGATIVE   Resp Syncytial Virus by PCR NEGATIVE NEGATIVE  Urinalysis, Routine w reflex microscopic -Urine, Clean Catch   Collection Time: 12/08/24 10:02 AM  Result Value Ref Range   Color, Urine AMBER (A) YELLOW   APPearance CLOUDY (A) CLEAR   Specific Gravity, Urine 1.019 1.005 - 1.030   pH 5.0 5.0 - 8.0   Glucose, UA 150 (A) NEGATIVE mg/dL   Hgb urine dipstick NEGATIVE NEGATIVE   Bilirubin Urine NEGATIVE NEGATIVE   Ketones, ur 5 (A) NEGATIVE mg/dL   Protein, ur 30 (A) NEGATIVE mg/dL   Nitrite NEGATIVE NEGATIVE   Leukocytes,Ua MODERATE (A) NEGATIVE   RBC / HPF 0-5 0 - 5 RBC/hpf   WBC, UA >50 0 - 5 WBC/hpf   Bacteria, UA MANY (A) NONE SEEN   Squamous Epithelial / HPF 6-10 0 - 5 /HPF   WBC Clumps PRESENT    Mucus PRESENT     Assessment & Plan:  1. Annual physical exam (Primary) Discussed preventative screenings, vaccines,  lab work, and healthy lifestyle with patient. Resources given for dental offices in [place]. - CBC with Differential/Platelet - Comprehensive metabolic panel with GFR - TSH - Lipid panel  2. Type 2 diabetes mellitus without complication, without long-term current use of insulin  (HCC) Currently on Ozempic  injections which patient started last week. Plan to recheck A1C in 3 months. Emphasized importance of proper nutrition  3. Breast cancer screening by mammogram Order placed today. - MM 3D SCREENING MAMMOGRAM BILATERAL BREAST; Future  4. Screening for colon cancer Order placed today. - Cologuard  5. Toenail fungus Referral placed for potential toe fungus. Pt states her toenail has been black and will grow out and fall off multiple times. - Ambulatory referral to Podiatry   6. Essential hypertension Well controlled on current regimen.    PATIENT COUNSELING:  - Encouraged a healthy well-balanced diet. Patient may adjust caloric intake to maintain or achieve ideal body weight. May reduce intake of dietary saturated fat and total fat and have adequate dietary potassium and calcium preferably from fresh fruits, vegetables, and low-fat dairy products.   - Advised to avoid cigarette smoking. - Discussed with the patient that most people either abstain from alcohol or drink within safe limits (<=14/week and <=4 drinks/occasion for males, <=7/weeks and <= 3 drinks/occasion for females) and that the risk for alcohol disorders and other health effects rises proportionally with the number of drinks per week and how often a drinker exceeds daily limits. - Discussed cessation/primary prevention of drug use and availability of treatment for abuse.  - Discussed sexually transmitted diseases, avoidance of unintended pregnancy and contraceptive alternatives.  - Stressed the importance of regular exercise - Injury prevention: Discussed safety belts, safety helmets, smoke detector, smoking near  bedding or upholstery.  - Dental health: Discussed importance of regular tooth brushing, flossing, and dental visits.   NEXT PREVENTATIVE PHYSICAL DUE IN 1 YEAR.  Return in 2 months (on 02/20/2025) for DM/HTN follow up.  Patient to reach out to office if new, worrisome, or unresolved symptoms arise or if no improvement in patient's condition. Patient verbalized understanding and is agreeable to treatment plan. All questions answered to patient's satisfaction.    Lauraine Almarie Angus DNP, FNP-C     [1] No Known Allergies [2]  Social History Tobacco Use  Smoking Status Never  Smokeless Tobacco Never   "

## 2024-12-12 ENCOUNTER — Ambulatory Visit (HOSPITAL_BASED_OUTPATIENT_CLINIC_OR_DEPARTMENT_OTHER): Payer: Self-pay

## 2024-12-12 LAB — COMPREHENSIVE METABOLIC PANEL WITH GFR
ALT: 23 IU/L (ref 0–32)
AST: 17 IU/L (ref 0–40)
Albumin: 4.2 g/dL (ref 3.8–4.9)
Alkaline Phosphatase: 93 IU/L (ref 49–135)
BUN/Creatinine Ratio: 12 (ref 9–23)
BUN: 11 mg/dL (ref 6–24)
Bilirubin Total: 0.6 mg/dL (ref 0.0–1.2)
CO2: 23 mmol/L (ref 20–29)
Calcium: 9.7 mg/dL (ref 8.7–10.2)
Chloride: 102 mmol/L (ref 96–106)
Creatinine, Ser: 0.94 mg/dL (ref 0.57–1.00)
Globulin, Total: 3.1 g/dL (ref 1.5–4.5)
Glucose: 186 mg/dL — ABNORMAL HIGH (ref 70–99)
Potassium: 3.7 mmol/L (ref 3.5–5.2)
Sodium: 141 mmol/L (ref 134–144)
Total Protein: 7.3 g/dL (ref 6.0–8.5)
eGFR: 72 mL/min/1.73

## 2024-12-12 LAB — CBC WITH DIFFERENTIAL/PLATELET
Basophils Absolute: 0 x10E3/uL (ref 0.0–0.2)
Basos: 1 %
EOS (ABSOLUTE): 0 x10E3/uL (ref 0.0–0.4)
Eos: 0 %
Hematocrit: 48.6 % — ABNORMAL HIGH (ref 34.0–46.6)
Hemoglobin: 16.1 g/dL — ABNORMAL HIGH (ref 11.1–15.9)
Immature Grans (Abs): 0 x10E3/uL (ref 0.0–0.1)
Immature Granulocytes: 0 %
Lymphocytes Absolute: 2.1 x10E3/uL (ref 0.7–3.1)
Lymphs: 28 %
MCH: 29.3 pg (ref 26.6–33.0)
MCHC: 33.1 g/dL (ref 31.5–35.7)
MCV: 88 fL (ref 79–97)
Monocytes Absolute: 0.5 x10E3/uL (ref 0.1–0.9)
Monocytes: 7 %
Neutrophils Absolute: 4.7 x10E3/uL (ref 1.4–7.0)
Neutrophils: 64 %
Platelets: 254 x10E3/uL (ref 150–450)
RBC: 5.5 x10E6/uL — ABNORMAL HIGH (ref 3.77–5.28)
RDW: 13 % (ref 11.7–15.4)
WBC: 7.4 x10E3/uL (ref 3.4–10.8)

## 2024-12-12 LAB — LIPID PANEL
Chol/HDL Ratio: 4 ratio (ref 0.0–4.4)
Cholesterol, Total: 174 mg/dL (ref 100–199)
HDL: 43 mg/dL
LDL Chol Calc (NIH): 115 mg/dL — ABNORMAL HIGH (ref 0–99)
Triglycerides: 86 mg/dL (ref 0–149)
VLDL Cholesterol Cal: 16 mg/dL (ref 5–40)

## 2024-12-12 LAB — TSH: TSH: 1.23 u[IU]/mL (ref 0.450–4.500)

## 2024-12-12 NOTE — Progress Notes (Signed)
 Hi [name], In looking over your labs: your blood counts are looking good!  Your kidney, liver, and electrolytes are within normal range. Both your cholesterol and thyroid levels are also normal! Keep up with good work! Lauraine Almarie Angus DNP, FNP-C

## 2024-12-24 ENCOUNTER — Inpatient Hospital Stay: Admission: RE | Admit: 2024-12-24 | Source: Ambulatory Visit

## 2024-12-29 ENCOUNTER — Telehealth (HOSPITAL_BASED_OUTPATIENT_CLINIC_OR_DEPARTMENT_OTHER): Payer: Self-pay

## 2024-12-29 DIAGNOSIS — E119 Type 2 diabetes mellitus without complications: Secondary | ICD-10-CM

## 2024-12-29 NOTE — Telephone Encounter (Signed)
 Copied from CRM [id number]. Topic: Clinical - Medication Refill >> Dec 29, 2024  9:30 AM Emylou G wrote: Medication: Semaglutide ,0.25 or 0.5MG /DOS, (OZEMPIC , 0.25 OR 0.5 MG/DOSE,) 2 MG/3ML SOPN  Has the patient contacted their pharmacy? No (Agent: If no, request that the patient contact the pharmacy for the refill. If patient does not wish to contact the pharmacy document the reason why and proceed with request.) (Agent: If yes, when and what did the pharmacy advise?)  This is the patient's preferred pharmacy:  [place] [facility] [address], [address] [place] [address] Phone: [phone] Fax: [phone]  Is this the correct pharmacy for this prescription? Yes If no, delete pharmacy and type the correct one.   Has the prescription been filled recently? No  Is the patient out of the medication? No - 2 left  Has the patient been seen for an appointment in the last year OR does the patient have an upcoming appointment? Yes  Can we respond through MyChart? Yes  Agent: Please be advised that Rx refills may take up to 3 business days. We ask that you follow-up with your pharmacy.

## 2024-12-29 NOTE — Telephone Encounter (Signed)
 Mychart sent to pt about her ozempic . Will wait for a response.

## 2024-12-29 NOTE — Telephone Encounter (Signed)
 Please see mychart message sent by pt and advise so we can get correct ozempic  dose sent in for pt.

## 2024-12-31 ENCOUNTER — Encounter (HOSPITAL_COMMUNITY): Payer: Self-pay

## 2024-12-31 ENCOUNTER — Other Ambulatory Visit (HOSPITAL_COMMUNITY): Payer: Self-pay

## 2024-12-31 MED ORDER — OZEMPIC (0.25 OR 0.5 MG/DOSE) 2 MG/3ML ~~LOC~~ SOPN
PEN_INJECTOR | SUBCUTANEOUS | 0 refills | Status: AC
  Filled 2024-12-31: qty 3, 42d supply, fill #0

## 2024-12-31 NOTE — Telephone Encounter (Signed)
 Does patient need a refill on Ozempic  at current dose or increased dosage.

## 2024-12-31 NOTE — Telephone Encounter (Signed)
 Copied from CRM [id number]. Topic: Clinical - Medication Refill >> Dec 31, 2024 11:09 AM Avram MATSU wrote: Medication: Semaglutide ,0.25 or 0.5MG /DOS, (OZEMPIC , 0.25 OR 0.5 MG/DOSE,) 2 MG/3ML SOPN [[id number]]  Has the patient contacted their pharmacy? Yes (Agent: If no, request that the patient contact the pharmacy for the refill. If patient does not wish to contact the pharmacy document the reason why and proceed with request.) (Agent: If yes, when and what did the pharmacy advise?)  This is the patient's preferred pharmacy:  [place] [facility] [address], [address] [place] [address] Phone: [phone] Fax: [phone]  Is this the correct pharmacy for this prescription? Yes If no, delete pharmacy and type the correct one.   Has the prescription been filled recently? No  Is the patient out of the medication? No 1 week left  Has the patient been seen for an appointment in the last year OR does the patient have an upcoming appointment? Yes  Can we respond through MyChart? Yes  Agent: Please be advised that Rx refills may take up to 3 business days. We ask that you follow-up with your pharmacy.

## 2024-12-31 NOTE — Telephone Encounter (Signed)
 Refill sent

## 2024-12-31 NOTE — Addendum Note (Signed)
 Addended by: RAYANN REXENE HERO on: 12/31/2024 03:30 PM   Modules accepted: Orders

## 2025-01-01 ENCOUNTER — Other Ambulatory Visit (HOSPITAL_COMMUNITY): Payer: Self-pay

## 2025-01-21 ENCOUNTER — Ambulatory Visit (HOSPITAL_BASED_OUTPATIENT_CLINIC_OR_DEPARTMENT_OTHER)

## 2025-02-17 ENCOUNTER — Ambulatory Visit (HOSPITAL_COMMUNITY): Payer: Self-pay | Admitting: Family

## 2025-02-20 ENCOUNTER — Ambulatory Visit (HOSPITAL_BASED_OUTPATIENT_CLINIC_OR_DEPARTMENT_OTHER)
